# Patient Record
Sex: Male | Born: 1955 | Race: White | Hispanic: No | Smoking: Current every day smoker
Health system: Southern US, Community
[De-identification: ages and names within clinical notes are randomized; demographics above are authoritative.]

## PROBLEM LIST (undated history)

## (undated) DIAGNOSIS — J449 Chronic obstructive pulmonary disease, unspecified: Secondary | ICD-10-CM

## (undated) DIAGNOSIS — I219 Acute myocardial infarction, unspecified: Secondary | ICD-10-CM

## (undated) DIAGNOSIS — F319 Bipolar disorder, unspecified: Secondary | ICD-10-CM

## (undated) DIAGNOSIS — M1990 Unspecified osteoarthritis, unspecified site: Secondary | ICD-10-CM

## (undated) DIAGNOSIS — I10 Essential (primary) hypertension: Secondary | ICD-10-CM

## (undated) DIAGNOSIS — I2510 Atherosclerotic heart disease of native coronary artery without angina pectoris: Secondary | ICD-10-CM

## (undated) HISTORY — PX: HERNIA REPAIR: SHX51

## (undated) HISTORY — PX: CARDIAC CATHETERIZATION: SHX172

## (undated) HISTORY — PX: APPENDECTOMY: SHX54

## (undated) HISTORY — PX: CORONARY ANGIOPLASTY: SHX604

---

## 2002-02-15 ENCOUNTER — Inpatient Hospital Stay (HOSPITAL_COMMUNITY): Admission: EM | Admit: 2002-02-15 | Discharge: 2002-02-18 | Payer: Self-pay | Admitting: Psychiatry

## 2002-03-08 ENCOUNTER — Inpatient Hospital Stay (HOSPITAL_COMMUNITY): Admission: EM | Admit: 2002-03-08 | Discharge: 2002-03-11 | Payer: Self-pay | Admitting: Psychiatry

## 2002-03-14 ENCOUNTER — Other Ambulatory Visit (HOSPITAL_COMMUNITY): Admission: RE | Admit: 2002-03-14 | Discharge: 2002-03-25 | Payer: Self-pay | Admitting: Psychiatry

## 2004-10-30 ENCOUNTER — Inpatient Hospital Stay (HOSPITAL_COMMUNITY): Admission: RE | Admit: 2004-10-30 | Discharge: 2004-10-31 | Payer: Self-pay | Admitting: Neurosurgery

## 2005-06-18 ENCOUNTER — Ambulatory Visit: Payer: Self-pay | Admitting: Cardiology

## 2005-06-19 ENCOUNTER — Ambulatory Visit: Payer: Self-pay | Admitting: Cardiology

## 2005-06-19 ENCOUNTER — Encounter: Payer: Self-pay | Admitting: Cardiology

## 2005-06-19 ENCOUNTER — Inpatient Hospital Stay (HOSPITAL_COMMUNITY): Admission: AD | Admit: 2005-06-19 | Discharge: 2005-06-21 | Payer: Self-pay | Admitting: Cardiology

## 2005-07-23 ENCOUNTER — Ambulatory Visit: Payer: Self-pay | Admitting: Cardiology

## 2006-09-15 HISTORY — PX: ANTERIOR LAT LUMBAR FUSION: SHX1168

## 2007-03-09 ENCOUNTER — Inpatient Hospital Stay (HOSPITAL_COMMUNITY): Admission: RE | Admit: 2007-03-09 | Discharge: 2007-03-09 | Payer: Self-pay | Admitting: Neurosurgery

## 2009-06-10 ENCOUNTER — Inpatient Hospital Stay (HOSPITAL_COMMUNITY): Admission: EM | Admit: 2009-06-10 | Discharge: 2009-06-13 | Payer: Self-pay | Admitting: Emergency Medicine

## 2010-12-20 LAB — LIPID PANEL
Cholesterol: 174 mg/dL (ref 0–200)
Total CHOL/HDL Ratio: 5.2 RATIO
Total CHOL/HDL Ratio: 6.2 RATIO
VLDL: 50 mg/dL — ABNORMAL HIGH (ref 0–40)

## 2010-12-20 LAB — CBC
HCT: 39 % (ref 39.0–52.0)
MCHC: 34.1 g/dL (ref 30.0–36.0)
MCHC: 34.5 g/dL (ref 30.0–36.0)
MCV: 93.6 fL (ref 78.0–100.0)
Platelets: 244 10*3/uL (ref 150–400)
Platelets: 255 10*3/uL (ref 150–400)
RDW: 13.5 % (ref 11.5–15.5)
RDW: 13.6 % (ref 11.5–15.5)
WBC: 12.1 10*3/uL — ABNORMAL HIGH (ref 4.0–10.5)

## 2010-12-20 LAB — CARDIAC PANEL(CRET KIN+CKTOT+MB+TROPI)
CK, MB: 65.5 ng/mL — ABNORMAL HIGH (ref 0.3–4.0)
Total CK: 651 U/L — ABNORMAL HIGH (ref 7–232)
Total CK: 821 U/L — ABNORMAL HIGH (ref 7–232)
Troponin I: 11.91 ng/mL (ref 0.00–0.06)
Troponin I: 18.87 ng/mL (ref 0.00–0.06)

## 2010-12-20 LAB — TROPONIN I: Troponin I: 5.72 ng/mL (ref 0.00–0.06)

## 2010-12-20 LAB — BASIC METABOLIC PANEL
BUN: 8 mg/dL (ref 6–23)
Calcium: 7.8 mg/dL — ABNORMAL LOW (ref 8.4–10.5)
Calcium: 8.7 mg/dL (ref 8.4–10.5)
Chloride: 108 mEq/L (ref 96–112)
Creatinine, Ser: 0.65 mg/dL (ref 0.4–1.5)
GFR calc Af Amer: >60 mL/min (ref >60)
GFR calc non Af Amer: >60 mL/min (ref >60)
Glucose, Bld: 92 mg/dL (ref 70–99)

## 2010-12-20 LAB — POCT I-STAT, CHEM 8
BUN: 17 mg/dL (ref 6–23)
Calcium, Ion: 1.15 mmol/L (ref 1.12–1.32)
Creatinine, Ser: 0.6 mg/dL (ref 0.4–1.5)
Glucose, Bld: 131 mg/dL — ABNORMAL HIGH (ref 70–99)
TCO2: 21 mmol/L (ref 0–100)

## 2010-12-20 LAB — COMPREHENSIVE METABOLIC PANEL
ALT: 21 U/L (ref 0–53)
Alkaline Phosphatase: 66 U/L (ref 39–117)
CO2: 25 mEq/L (ref 19–32)
Chloride: 107 mEq/L (ref 96–112)
Glucose, Bld: 132 mg/dL — ABNORMAL HIGH (ref 70–99)
Potassium: 2.9 mEq/L — ABNORMAL LOW (ref 3.5–5.1)
Sodium: 138 mEq/L (ref 135–145)
Total Protein: 5.9 g/dL — ABNORMAL LOW (ref 6.0–8.3)

## 2010-12-20 LAB — PLATELET COUNT: Platelets: 254 10*3/uL (ref 150–400)

## 2010-12-20 LAB — CK TOTAL AND CKMB (NOT AT ARMC)
CK, MB: 14.1 ng/mL — ABNORMAL HIGH (ref 0.3–4.0)
CK, MB: 6.1 ng/mL — ABNORMAL HIGH (ref 0.3–4.0)
Relative Index: 6 — ABNORMAL HIGH (ref 0.0–2.5)

## 2010-12-20 LAB — GLUCOSE, CAPILLARY: Glucose-Capillary: 85 mg/dL (ref 70–99)

## 2010-12-20 LAB — PROTIME-INR: Prothrombin Time: 12.5 seconds (ref 11.6–15.2)

## 2011-01-28 NOTE — Op Note (Signed)
Christopher Conley, Christopher Conley NO.:  [id number]   MEDICAL RECORD NO.:  [id number]          PATIENT TYPE:  INP   LOCATION:  2899                         FACILITY:  [facility]   PHYSICIAN:  Kathaleen Maser. Pool, M.D.    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   PROCEDURES:  1. L4-5 decompressive laminectomies with L4 and L5 decompressive      foraminotomies, more than would be required for simple interbody      fusion alone.  2. L4-5 posterior lumbar interbody fusion utilizing Tangent interbody      allograft wedge, Telemon interbody PEEK cage, and local      autografting.  3. L4-5 posterolateral arthrodesis utilizing nonsegmental pedicle      screw sedation and local autografting.   SURGEON:  Kathaleen Maser. Pool, M.D.   ASSISTANT:  Reinaldo Meeker, M.D.   ANESTHESIA:  General.   INDICATION:  Christopher Conley is a 55 year old male with a history of severe  back and bilateral lower extremity pain, failing all conservative  measures.  Workup demonstrates evidence of a significant grade 1  __________  spondylolisthesis with marked stenosis involving both the  neural foramen and central canal.  The patient has been counseled as to  his options.  He has decided to proceed with L4-5 decompression and  fusion in hopes of improving symptoms.   OPERATIVE NOTE:  The patient was taken to the operating room and placed  on the table in a supine position.  After an adequate level of  anesthesia was achieved, the patient was positioned prone onto a Wilson  frame, appropriately padded.  The patient's lumbar region was prepped  and draped sterilely.  A #10 blade was used to make a linear skin  incision overlying the L4-5  interspace.  This was carried down sharply  in the midline.  Subperiosteal dissection was then performed exposing  the laminae and facet joints at L3, L4 and L5 as well as the transverse  processes of L4 and L5.  A deep self-retaining retractor was placed,  intraoperative fluoroscopy was used, and the level was confirmed.  Decompressive laminectomy was then performed at L4-5 using Leksell  rongeurs, Kerrison rongeurs and a high-speed drill to remove the entire  lamina of L4, the superior aspect of the lamina of L5, the entire  inferior facet of L4 bilaterally, and superior facet of L5 bilaterally.  All bone removed was cleaned and used in later autografting.  Ligament  flavum was then elevated and resected in a piecemeal fashion using  Kerrison rongeurs.  Wide decompressive foraminotomies were then  performed along the exiting L4 and L5 nerve roots bilaterally.  Epidural  venous plexus was coagulated and cut.  Starting first at the patient's  left side, thecal sac and nerve roots were mobilized and retracted  towards the midline.  Disk space incised with a 15 blade in a  rectangular fashion.  A wide space clean-out was then achieved using  pituitary rongeurs, upward-angled pituitary rongeurs and Epstein  curettes.  After an aggressive diskectomy was performed on this side,  the procedure was then repeated on the contralateral side.  The disk  space was then sequentially dilated up to 10 mm with a 10 mm distractor  left on the patient's right side.  Thecal sac and nerve roots were  protected on the patient's left side.  Disk space was then reamed and  then cut with 10 mm Tangent instruments.  Soft tissue was removed from  the interspace.  A 10 x 26 mm Tangent wedge was then impacted into  place, recessed approximately 2 mm from the posterior cortical margin of  L4.  Distractor was removed from the patient's right side.  Thecal sac  nerve roots protected on the right side.  Disk space was  then reamed and  then cut with 10 mm instruments again.  Soft tissue was removed from the  interspace.  The disk space was further curetted.  Morselized autograft  was packed in the interspace.  A 10 x 26 mm Telemon cage packed with  morselized autograft and Progenics bone putty was then impacted into  place and recessed approximately 2 mm from the posterior cortical margin  of L4.  The pedicles at L4 and L5 were then identified using surface  landmarks and intraoperative fluoroscopy.  Superficial bone around the  pedicle was then removed using a high-speed drill.  Each pedicle was  then probed using a pedicle awl.  Each pedicle awl track was then tapped  with a 5.25 mm screw tap.  Each screw tap hole was probed and found be  solidly within the bone.  Radius 6.75 x 45 mm screws were placed  bilaterally at L4 and L5.  Transverse processes at L4 and L5 were then  decorticated using the high-speed drill.  Morselized autograft mixed  with Progenics putty was packed posterolaterally for later fusion.  A  short segment of titanium rod was then placed over screw heads at L4 and  L5.  The locking caps were then engaged and the construct was given a  final tightening while under compression.  Final images revealed good  position of the bone grafts and hardware, proper operative level, and  normal alignment of the spine.  The wound was then irrigated with  antibiotic solution.  Gelfoam was placed topically for hemostasis and  found to be good.  A medium Hemovac drain was left in the epidural  space.  The wounds were closed in layers with Vicryl suture.  Steri-  Strips and sterile dressing were applied.  There were no apparent  complications.  The patient tolerated the procedure well and he returns  to the recovery room postoperatively.           ______________________________  Kathaleen Maser Pool, M.D.     HAP/MEDQ  D:  03/09/2007  T:  03/09/2007  Job:  [id number]
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
## 2011-01-31 NOTE — Cardiovascular Report (Signed)
[name], [name] NO.:  [id number]   MEDICAL RECORD NO.:  [id number]          PATIENT TYPE:  INP   LOCATION:  6524                         FACILITY:  [facility]   PHYSICIAN:  Arvilla Meres, M.D. LHCDATE OF BIRTH:  [birth year]   DATE OF PROCEDURE:  06/20/2005  DATE OF DISCHARGE:                              CARDIAC CATHETERIZATION   PRIMARY CARE PHYSICIAN:  Dr. Kirstie Peri in [place].   CARDIOLOGIST:  Dr. Simona Huh in the [place] office.   PATIENT IDENTIFICATION:  Christopher Conley is a 55 year old male with no known  history of coronary disease, but multiple cardiac risk factors and including  hypertension and hyperlipidemia as well as tobacco use, who was admitted  with acute coronary syndrome.   DESCRIPTION OF PROCEDURE:  The risks and benefits of the procedure were  discussed with Mr. Rames; consent was signed and placed on the chart.  A 6-  [place] arterial sheath was placed in right femoral artery using a modified  Seldinger technique.  A JL4 was used to image the left coronary system.  Initially, a JR4 was used to intubate the right coronary artery, but there  was a significant amount of spasm and catheter damping.  Thus we switched  out for a 5-French catheter.  We had some damping at that point and some  intracoronary nitroglycerin was given.  An angled pigtail was used for left  ventriculogram.  All catheter exchanges were made over a wire; there are no  apparent complications.  Central aortic pressure is 116/64 with a mean of  86.  LV pressure was 111/5 with an EDP of 13.  There is no gradient on a  aortic valve pullback.   CORONARY ANATOMY:  Left main:  Angiographically normal.   LAD was a long vessel wrapping the apex.  It gave off a single large  diagonal.  There was evidence of a ruptured plaque in the proximal LAD at  the takeoff of the large diagonal with a 99% stenosis.  In the body of the  first diagonal, there was a 40% tubular lesion.   The left  circumflex was a moderate-sized system.  It gave off a tiny OM-1, a  large branching OM-2 and a tiny OM-3 .  There was no angiographic CAD.   Right coronary artery was a large dominant vessel.  It gave off a right  ventricular branch, an acute marginal branch which served the PDA territory  and a small PL1 and a moderate-sized PL2.  There was a 50% to 60% lesion  ostially which likely represented spasm.  In the midsection of vessel, right  before the takeoff of the right ventricular branch, there was a 30% to 40%  tubular lesion and then a 30% distal lesion before the PL2.   Left ventriculogram done in the RAO approach showed an EF of approximately  50% with high anterior hypokinesis.  There was no mitral regurgitation.   ASSESSMENT:  1.  Single-vessel coronary artery disease with a ruptured plaque in the      proximal left anterior descending.  2.  Mild  decrease in left ventricular function with anterior wall motion      abnormality.   PLAN:  1.  PCI of the LAD with Dr. Randa Evens.  2.  Aggressive risk factor modification.      Arvilla Meres, M.D. [facility]  Electronically Signed     DB/MEDQ  D:  06/20/2005  T:  06/20/2005  Job:  [id number]   cc:   Kirstie Peri, MD  Fax: [phone]   Jonelle Sidle, M.D. [facility]  [address]  [place]  [place] [address]
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
## 2011-01-31 NOTE — Discharge Summary (Signed)
NAME:  Christopher Conley, Christopher Conley NO.:  [id number]   MEDICAL RECORD NO.:  [id number]                  PATIENT TYPE:  IPS   LOCATION:  PIOP                                 FACILITY:  [facility]   PHYSICIAN:  Jeanice Lim, MD                DATE OF BIRTH:  Dec 08, 1955   DATE OF ADMISSION:  03/08/2002  DATE OF DISCHARGE:  03/11/2002                                 DISCHARGE SUMMARY   IDENTIFYING DATA:  This is a 55 year old Caucasian male status post an  overdose on Seroquel, Prozac, and Depakote after an argument with his wife.  He described a history of sleeping too much, progressively irritable and  agitated prior to admission.   MEDICATIONS:  1. Prozac.  2. Depakote.  3. Seroquel.   DRUG ALLERGIES:  PENICILLIN.   PHYSICAL EXAMINATION:  GENERAL: Essentially within normal limits.  NEUROLOGIC: Nonfocal.   LABORATORY DATA:  Routine admission labs:  Potassium slightly low at 3.3,  glucose elevated at 168.  White count slightly elevated at 12,000.  Otherwise, within normal limits.   MENTAL STATUS EXAM:  Muscular, well built male, disheveled with poor  hygiene, appearing 10 years older than stated age, cooperative, polite with  blunted affect.  Speech: Relevant.  Mood: Depressed, remorseful, feeling  guilty about overdose and to losing his temper.  Thought process: Goal  directed.  Thought content: Negative for dangerous ideation or psychotic  symptoms.  He reported concern about losing his temper and not understanding  what happens and why he gets paranoid and easily frustrated.  Cognitive:  Intact.  Intellectual capacity: Limited.  Insight and impulse control: Poor.   ADMISSION DIAGNOSES:   AXIS I:  1. Major depression, recurrent, severe with psychosis.  2. Rule out intermittent explosive disorder.   AXIS II:  None.   AXIS III:  1. Status post multiple pharmacy overdose.  2. Chronic obstructive pulmonary disease.   AXIS IV:  Moderate problems related  to anger management and mental health  needs.   AXIS V:  [id number]   HOSPITAL COURSE:  The patient was admitted and ordered routine p.r.n.  medications, underwent further monitoring, and was encouraged to participate  in individual, group, and milieu therapy.  The patient was started on  Risperdal and Prozac and Depakote, which were titrated.  A family meeting  was held to discuss aftercare and treatment issues.  The patient showed a  good response to clinical intervention.   CONDITION ON DISCHARGE:  Condition on discharge was improved.  Mood was more  stable.  Affect: Brighter.  Thought processes: Goal directed.  Thought  content: Negative for dangerous ideation or psychotic symptoms.  The patient  reported increased insight and improved judgment, reporting motivation to be  compliant with followup plan.   DISCHARGE MEDICATIONS:  1. Prozac 20 mg q.a.m.  2. Depakote 250 mg three q.h.s.  3. Risperdal 0.5 mg one q.h.s.  FOLLOW UP:  He was to follow-up with [facility] on June  30 at 9 a.m.   DISCHARGE DIAGNOSES:   AXIS I:  1. Major depression, recurrent, severe with psychosis.  2. Rule out intermittent explosive disorder.   AXIS II:  None.   AXIS III:  1. Status post multiple pharmacy overdose.  2. Chronic obstructive pulmonary disease.   AXIS IV:  Moderate problems related to anger management and mental health  needs.   AXIS V:  Global assessment of functioning on discharge was 55.                                                Jeanice Lim, MD    JEM/MEDQ  D:  05/19/2002  T:  05/20/2002  Job:  [id number]

## 2011-01-31 NOTE — Discharge Summary (Signed)
NAMEKAYLAN, FRIEDMANN NO.:  [id number]   MEDICAL RECORD NO.:  [id number]          PATIENT TYPE:  INP   LOCATION:  6524                         FACILITY:  [facility]   PHYSICIAN:  Theodore Demark, P.A. LHCDATE OF BIRTH:  [birth year]   DATE OF ADMISSION:  06/19/2005  DATE OF DISCHARGE:  06/20/2005                                 DISCHARGE SUMMARY   PRIMARY CARDIOLOGIST:  Jonelle Sidle, M.D.   PRIMARY CARE PHYSICIAN:  Dr. Sherryll Burger.   PRINCIPAL DIAGNOSIS:  Non-ST segment elevation myocardial infarction.   SECONDARY DIAGNOSES:  1.  Hyperlipidemia.  2.  Depression.  3.  Osteoarthritis with chronic back pain.  4.  Tobacco use.  5.  Family history of premature coronary artery disease.  6.  Hyperglycemia, hemoglobin A1c pending.   ALLERGIES:  PENICILLIN.   PROCEDURES:  1.  Cardiac catheterization.  2.  Coronary arteriogram.  3.  Left ventriculogram.  4.  Percutaneous intervention with a Bare Metal Stent to the left anterior      descending and the CEPIA trial.   HOSPITAL COURSE:  Mr. Germond is a 55 year old male with no known of coronary  artery disease.  He had exertional dyspnea and fatigue.  He came to the  emergency room on June 18, 2005, and his EKG had some changes.  His  troponin was elevated 1.01, consistent with non-ST segment elevation MI.  He  was transferred to [facility] for further evaluation and treatment.   He remained pain free in the hospital, and cardiac catheterization was  performed to further define his anatomy.  He was enrolled in the CEPIA  study.   The cardiac catheterization showed a 99% LAD with clot.  Dr. Gala Romney felt  that there was a ruptured plaque in the proximal LAD.  He has mildly  decreased LV function with an EF of 50% and anterior wall motion  abnormalities.  He has a 50-60% RCA with spasm and between 30 and 40% in the  mid and distal RCA and first diagonal.   Dr. Samule Ohm inserted a Bare Metal Stent to the LAD and  reduced the stenosis  from 99% to 0.   On June 21, 2005, his enzymes were minimally elevated from what they had  been prior to admission, but he was pain free.  He was ambulating without  difficulty.  His catheterization site was without ecchymosis or hematoma.  Of note, he was hyperglycemic with a blood sugar of 172.  A hemoglobin A1c  is pending at the time of discharge.  Additionally, a lipid profile was  performed which showed a total cholesterol of 221, triglycerides 246, HDL  35, LDL 137.  Zocor 40 will be added to his medication regimen.   Mr. Crissman is to be seen by Dr. Andee Lineman and by cardiac rehab, but he is  tentatively considered stable for discharge on June 21, 2005 without  outpatient followup arranged.   DISCHARGE INSTRUCTIONS:  1.  Activity level is to be increased gradually.  He is to do no driving for      two days  and no lifting for two weeks.  He is to call our office for      problems with the catheterization site.  2.  He is to follow up with Dr. Sherryll Burger as needed.  3.  He has an appointment with Arnette Felts, P.A.-C. or Jonelle Sidle,      M.D. on July 01, 2005 at 1:15.  4.  He is to stick to a diet that is low in fat, sugar, and carbohydrates.   DISCHARGE MEDICATIONS:  1.  Cymbalta.  2.  Risperdal.  3.  Pain control as prior to admission.  4.  Plavix 75 daily.  5.  Coated aspirin 325 mg daily.  6.  Nitroglycerin 0.4 mg p.r.n.  7.  Toprol XL 25 mg daily.  8.  Zocor 40 mg daily.   DURATION OF ENCOUNTER:  25 minutes.      Theodore Demark, P.A. [facility]     RB/MEDQ  D:  06/21/2005  T:  06/21/2005  Job:  [id number]   cc:   Sherryll Burger, M.D.  [facility]  [place], [place]

## 2011-01-31 NOTE — Op Note (Signed)
Christopher Conley, TRAWEEK NO.:  [id number]   MEDICAL RECORD NO.:  [id number]          PATIENT TYPE:  INP   LOCATION:  3034                         FACILITY:  [facility]   PHYSICIAN:  Cristi Loron, M.D.DATE OF BIRTH:  1956/05/25   DATE OF PROCEDURE:  10/30/2004  DATE OF DISCHARGE:                                 OPERATIVE REPORT   BRIEF HISTORY:  The patient is a 55 year old white male who suffers from an  approximately one month history of severe left arm pain consistent with a  left C7 radiculopathy.  He has failed medical management and was worked up  with a cervical MRI which demonstrated a large herniated disc at C6-C7 on  the left.  I discussed the various treatment options with the patient  including surgery.  The patient has weighed the risks, benefits, and  alternatives of surgery and decided to proceed with a C6-C7 anterior  cervical discectomy, fusion, and plating.   PREOPERATIVE DIAGNOSIS:  Left C6-C7 herniated nucleus pulposus, spinal  stenosis, cervical radiculopathy, cervicalgia.   POSTOPERATIVE DIAGNOSIS:  Left C6-C7 herniated nucleus pulposus, spinal  stenosis, cervical radiculopathy, cervicalgia.   PROCEDURE:  C6-C7 extensive anterior cervical discectomy/decompression;  interbody iliac crest allograft arthrodesis; anterior cervical plating  (Codman Slimlock titanium plate and screws).   SURGEON:  Cristi Loron, M.D.   ASSISTANT:  Stefani Dama, M.D.   ANESTHESIA:  General endotracheal anesthesia.   ESTIMATED BLOOD LOSS:  50 mL.   SPECIMENS:  None.   DRAINS:  None.   COMPLICATIONS:  None.   DESCRIPTION OF PROCEDURE:  The patient was brought to the operating room by  the anesthesia team.  General endotracheal anesthesia was induced.  A roll  was placed under the patient's shoulders to place the neck in slight  extension.  His anterior cervical region was prepared with Betadine scrub  and Betadine solution.  Sterile drapes were  applied.  I then injected the  area to be incised with Marcaine with epinephrine solution.  I used a  scalpel to make a transverse incision in the patient's left anterior neck.  I used the Metzenbaum scissors to divide the platysmal muscle and to dissect  medial to the sternocleidomastoid muscle, jugular vein, and carotid artery.  I bluntly dissected down towards the anterior cervical spine, carefully  identified the esophagus and retracted it medially.  I used Kitner swabs to  clear soft tissue from the anterior cervical spine and then inserted a bent  spinal needle into the upper exposed interspace.  I obtained an  interoperative radiograph to confirm our location.   We then used electrocautery to detach the medial border of the longus colli  muscles bilaterally from C6-C7 intervertebral disc space.  We inserted the  Caspar self-retaining retractor underneath the longus colli muscles  bilaterally for exposure.  We incised the C6-C7 intervertebral disc and  performed a partial discectomy using pituitary forceps.  The disc space was  very stenotic.  We then inserted distraction screws at C6 and C7 and  distracted the interspace.  We used the high speed drill  to decorticate the  vertebral endplates at C6-C7, drill away the remainder of the C6-C7  intervertebral disc, drilled away the posterior spondylosis, and then  thinned out the posterior longitudinal ligament.  We incised the ligament  with the arachnoid knife.  I then removed it with a Kerrison punch under  cutting the vertebral endplate decompressing the thecal sac.  We then  performed a foraminotomy about the bilateral C7 nerve root.  On the left  side, we encountered a huge herniated disc and removed it in multiple  fragments using pituitary forceps and a Kerrison punch.  We performed a  generous foraminotomy about the bilateral C7 nerve root completing the  decompression.   We now turned our attention to the arthrodesis.  We  obtained iliac crest  tricortical allograft bone graft and fashioned it to the dimensions, 7 mm  height, 1 cm depth.  We inserted the bone graft in the distracted C6-C7  interspace and then removed the distraction screws.  There was a good snug  fit of bone graft.   We now turned our attention to the instrumentation.  We used a high speed  drill to drill away some ventral spondylosis from the vertebral endplates of  C6-C7 so that the plate would lie flat.  We selected the appropriate length  Codman Slimlock anterior cervical plate, we laid it on the anterior aspect  of the vertebral bodies from C6 to C7, we drilled two holes into C6 and C7.  We secured the plate to the vertebral bodies by placing two 12 mm self-  tapping screws at C6 and two at C7.  We obtained an interoperative  radiograph.  We had limited visualization because of the patient's  shoulders, but the plate and screws looked good en vivo.  We secured the  screws and plate by locking each cam.   We then obtained stringent hemostasis using bipolar electrocautery.  We  copiously irrigated the wound out with Bacitracin solution and removed the  solution.  We then removed the Caspar self-retaining retractor.  We  inspected the esophagus for any damage and there was none apparent.  We then  reapproximated the patient's platysmal muscle with interrupted 3-0 Vicryl  suture, the subcutaneous tissue with interrupted 3-0 Vicryl suture, and the  skin with Steri-Strips and Benzoin.  The wound was then coated with  Bacitracin ointment.  A sterile dressing was applied, the drapes were  removed.  The patient was subsequently extubated by the anesthesia team and  transported to the post anesthesia care unit in stable condition.  All  sponge, instrument and needle counts were correct at the end of the case.      JDJ/MEDQ  D:  10/30/2004  T:  10/30/2004  Job:  [id number]

## 2011-01-31 NOTE — H&P (Signed)
Christopher Conley, PARCELL NO.:  [id number]   MEDICAL RECORD NO.:  [id number]          PATIENT TYPE:  INP   LOCATION:  6524                         FACILITY:  [facility]   PHYSICIAN:  Jonelle Sidle, M.D. LHCDATE OF BIRTH:  [birth year]   DATE OF ADMISSION:  06/19/2005  DATE OF DISCHARGE:                                HISTORY & PHYSICAL   PRIMARY CARE PHYSICIAN:  Kirstie Peri, M.D.   REASON FOR ADMISSION:  Transfer from [facility] with  evidence of non-ST elevation myocardial infarction.   HISTORY OF PRESENT ILLNESS:  Christopher Conley is a 55 year old male with an apparent  history of depression treated with Cymbalta, osteoarthritis with chronic  back pain, ongoing tobacco use of two packs per day, significant family  history of premature cardiovascular disease, and recent umbilical hernia  repair surgery this past Tuesday by Dr. Gabriel Cirri [place].  He  is now transferred to our facility, having presented to [facility] following a severe episode of chest pain and shortness of breath.  Christopher Conley states that he has in retrospect had exertional shortness of breath  and fatigue for the last several months.  Over the last one to two weeks he  has experienced chest aching lasting 15-20 minutes at a time associated with  shortness of breath and diaphoresis, typically occurring every two days and  culminating in a severe episode on October 4, prompting his evaluation in  the emergency department.  His electrocardiogram showed sinus rhythm with  poor anterior R-wave progression and nonspecific anterolateral ST-T wave  changes.  These look to be new compared to a prior tracing from January.  Troponin I levels increased from 0.20 up to 1.01 and chest x-ray showed some  evidence of mild pulmonary edema.  On transfer now, he is pain-free and  breathing comfortably.  He denies any history of coronary artery disease or  myocardial infarction.  He  has not undergone any prior cardiac testing as  best I can tell.   ALLERGIES:  No known drug allergies.   MEDICATIONS AT HOME:  Cymbalta and omeprazole as well as some type of pain  medicine.   PAST MEDICAL HISTORY:  1.  Depression.  2.  Possible hypertension, although not on chronic medications.  3.  Osteoarthritis with chronic back pain.  4.  History of cervical disk fusion in March or April of this year.  5.  Recent umbilical hernia surgery by Dr. Gabriel Cirri in [place], [place].      This was possibly laparoscopic.  He was actually to follow up this      Friday for postoperative care.   SOCIAL HISTORY:  The patient is single and lives alone.  He has one child.  He smokes two packs per day of tobacco.  He is unemployed.   FAMILY HISTORY:  Significant for premature cardiovascular disease in the  patient's father at age 29, a brother at age 70, and another brother at age  24 with recent bypass surgery.   REVIEW OF SYSTEMS:  As described in the history  of present illness.  He has  had no obvious bleeding problems.  He has had no orthopnea, palpitations or  syncope.  He denies lower extremity edema.  Review of systems otherwise  negative.   PHYSICAL EXAMINATION:  VITAL SIGNS:  Blood pressure 130/80, heart rate is in  the 80s in sinus rhythm.  The patient is afebrile.  GENERAL:  This is a somewhat disheveled but well-nourished male in no acute  distress.  HEENT:  Conjunctivae and lids are normal.  Oropharynx is clear.  NECK:  Supple without elevated jugular venous pressure, without carotid  bruits.  No thyromegaly is noted.  CHEST:  Lungs exhibit decreased breath sounds with prolonged expiratory  phase and mild expiratory wheezes.  CARDIAC:  Distant heart sounds with regular rate and rhythm and no S3 gallop  or pericardial rub.  ABDOMEN:  Soft with no hepatomegaly and no bruits.  EXTREMITIES:  No pitting edema.  Peripheral pulses are 1-2+.  SKIN:  No ulcerative changes are  noted.  MUSCULOSKELETAL:  No kyphosis noted.  NEUROPSYCHIATRIC:  The patient is alert and oriented x3.   Laboratory data from [facility]:  Glucose 121, BUN 11,  creatinine 0.8, sodium 137, potassium 3.7, chloride 106, bicarb 25.  WBC  14.1, hemoglobin 14.7, hematocrit 44.5, platelets 349.   IMPRESSION:  1.  Progressive dyspnea and fatigue on exertion culminating in episodic      chest pain and no evidence of non-ST elevation myocardial infarction      with abnormal electrocardiographic changes and cardiac markers.  2.  Possible hypertension, although on no chronic medications.  3.  Significant tobacco use history.  4.  Family history of premature cardiovascular disease.  5.  Status post recent umbilical hernia surgery this past Tuesday by Dr.      Gabriel Cirri in [place], [place].  This was possibly laparoscopic, and      the patient tells me that he had anticipated follow-up this Friday with      Dr. Gabriel Cirri.   PLAN:  1.  The patient is now admitted to the telemetry unit.  We will continue to      cycle cardiac markers.  2.  Treat with aspirin, Lopressor, heparin and nitroglycerin for now and      titrate as needed.  3.  Check fasting lipid profile, hemoglobin A1c, and follow up      electrocardiogram.  4.  Smoking cessation consult.  5.  Check 2-D echocardiogram for left ventricular function.  6.  Need to find out from Dr. Gabriel Cirri in [place] if there are any anticipated      problems with proceeding with cardiac catheterization in this patient      given his recent surgery.  I would suspect that he will need some type      of intervention and, therefore, Plavix.  Will otherwise be tentatively      scheduled for cardiac catheterization tomorrow assuming there are no      major contraindications.  I reviewed the risks and benefits with the      patient and he agrees to proceed at this point. 7.  Further plans to follow.           ______________________________   Jonelle Sidle, M.D. [facility]    SGM/MEDQ  D:  06/19/2005  T:  06/19/2005  Job:  [id number]   cc:   Kirstie Peri, MD  Fax: [phone]

## 2011-01-31 NOTE — Cardiovascular Report (Signed)
NAMEBELTON, PEPLINSKI NO.:  [id number]   MEDICAL RECORD NO.:  [id number]          PATIENT TYPE:  INP   LOCATION:  6524                         FACILITY:  [facility]   PHYSICIAN:  Salvadore Farber, M.D. LHCDATE OF BIRTH:  September 30, 1955   DATE OF PROCEDURE:  06/20/2005  DATE OF DISCHARGE:                              CARDIAC CATHETERIZATION   PROCEDURE:  Bear metal stenting in the proximal left anterior descending.   INDICATIONS FOR PROCEDURE:  Mr. Deroos is a 55 year old gentleman with ongoing  tobacco abuse, who presents with non-ST elevation myocardial infarction.  Troponin leak was small. He underwent diagnostic angiography by Dr.  Gala Romney this morning. 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 Left  ventricular systolic function was preserved. I was asked to perform  percutaneous re-vascularization.   DESCRIPTION OF PROCEDURE:  Informed consent was obtained. There was a pre-  existing 6 [place] sheath. This was up-sized over a wire to a 7 French sheath  in the right common femoral artery. A 7 [place] CLS 4 guide was advanced over  wire and engaged in the ostium of the left main. Anticoagulation was per the  CPIA trial randomization. He was treated with 600 mg of Plavix, prior to  beginning the intervention.   A Pro-water wire was advanced to the distal left anterior descending without  difficulty. A BMW wire was then advanced to the distal diagonal. The lesion  was pre-dilated using a 3.0 x 12 mm Maverick at 6 atmospheres. I then  stented the lesion using a 3.5 x 24 mm driver, deployed at 12 atmospheres.  This was deployed so as to trap the BMW wire, in case the diagonal was lost.  After stenting, a second BMW wire was advanced out the side of the stent,  into the diagonal. The original BMW was then removed from behind the  stent  without difficulty. The stent was then post-dilated using a 3.5 mm power  sail at 16 atmospheres. There was no significant plaque shift into the  diagonal. Final angiography demonstrated no residual stenosis, no  dissection, TIMI 3 flow to the distal vasculature. There remained mild  stenosis at the origin of the diagonal (less then 30%), and TIMI 3 flow in  it. The patient tolerated the procedure well and was transferred to the  holding room in stable condition.   COMPLICATIONS:  None.   IMPRESSION/PLAN:  Successful bare metal stenting of the proximal left  anterior descending. The jailed diagonal was not compromised. He should be  continued on Plavix for at least a year, given his acute presentation.  Aspirin should be continued indefinitely. Smoking cessation was strongly  advised.      Salvadore Farber, M.D. [facility]  Electronically Signed     WED/MEDQ  D:  06/20/2005  T:  06/20/2005  Job:  [id number]   cc:   Kirstie Peri, MD  Fax: [phone]   Arvilla Meres, M.D. [facility]  [employer]  [address]  [place]  [place] [address]

## 2011-07-02 LAB — DIFFERENTIAL
Basophils Relative: 1
Lymphs Abs: 2.8
Monocytes Relative: 5
Neutro Abs: 6.4
Neutrophils Relative %: 64

## 2011-07-02 LAB — BASIC METABOLIC PANEL
Calcium: 9.1
Creatinine, Ser: 0.78
GFR calc Af Amer: >60

## 2011-07-02 LAB — PROTIME-INR
INR: 0.9
Prothrombin Time: 12.3

## 2011-07-02 LAB — TYPE AND SCREEN

## 2011-07-02 LAB — CBC
MCHC: 34.2
Platelets: 300
RBC: 5
WBC: 10

## 2011-07-02 LAB — APTT: aPTT: 31

## 2015-09-18 DIAGNOSIS — Z6828 Body mass index (BMI) 28.0-28.9, adult: Secondary | ICD-10-CM | POA: Diagnosis not present

## 2015-09-18 DIAGNOSIS — J441 Chronic obstructive pulmonary disease with (acute) exacerbation: Secondary | ICD-10-CM | POA: Diagnosis not present

## 2015-09-18 DIAGNOSIS — E784 Other hyperlipidemia: Secondary | ICD-10-CM | POA: Diagnosis not present

## 2015-09-18 DIAGNOSIS — I10 Essential (primary) hypertension: Secondary | ICD-10-CM | POA: Diagnosis not present

## 2015-09-18 DIAGNOSIS — F17210 Nicotine dependence, cigarettes, uncomplicated: Secondary | ICD-10-CM | POA: Diagnosis not present

## 2015-11-30 DIAGNOSIS — Z6828 Body mass index (BMI) 28.0-28.9, adult: Secondary | ICD-10-CM | POA: Diagnosis not present

## 2015-11-30 DIAGNOSIS — R0981 Nasal congestion: Secondary | ICD-10-CM | POA: Diagnosis not present

## 2015-11-30 DIAGNOSIS — L2089 Other atopic dermatitis: Secondary | ICD-10-CM | POA: Diagnosis not present

## 2015-12-24 DIAGNOSIS — M9983 Other biomechanical lesions of lumbar region: Secondary | ICD-10-CM | POA: Diagnosis not present

## 2015-12-24 DIAGNOSIS — M47816 Spondylosis without myelopathy or radiculopathy, lumbar region: Secondary | ICD-10-CM | POA: Diagnosis not present

## 2016-02-13 DIAGNOSIS — Z8371 Family history of colonic polyps: Secondary | ICD-10-CM | POA: Diagnosis not present

## 2016-02-13 DIAGNOSIS — R194 Change in bowel habit: Secondary | ICD-10-CM | POA: Diagnosis not present

## 2016-02-19 ENCOUNTER — Encounter (INDEPENDENT_AMBULATORY_CARE_PROVIDER_SITE_OTHER): Payer: Self-pay | Admitting: *Deleted

## 2016-03-07 DIAGNOSIS — I2510 Atherosclerotic heart disease of native coronary artery without angina pectoris: Secondary | ICD-10-CM | POA: Diagnosis not present

## 2016-03-07 DIAGNOSIS — J441 Chronic obstructive pulmonary disease with (acute) exacerbation: Secondary | ICD-10-CM | POA: Diagnosis not present

## 2016-03-07 DIAGNOSIS — E784 Other hyperlipidemia: Secondary | ICD-10-CM | POA: Diagnosis not present

## 2016-03-07 DIAGNOSIS — Z6828 Body mass index (BMI) 28.0-28.9, adult: Secondary | ICD-10-CM | POA: Diagnosis not present

## 2016-03-07 DIAGNOSIS — I10 Essential (primary) hypertension: Secondary | ICD-10-CM | POA: Diagnosis not present

## 2016-03-19 ENCOUNTER — Other Ambulatory Visit (INDEPENDENT_AMBULATORY_CARE_PROVIDER_SITE_OTHER): Payer: Self-pay | Admitting: *Deleted

## 2016-03-19 ENCOUNTER — Telehealth (INDEPENDENT_AMBULATORY_CARE_PROVIDER_SITE_OTHER): Payer: Self-pay | Admitting: *Deleted

## 2016-03-19 ENCOUNTER — Encounter (INDEPENDENT_AMBULATORY_CARE_PROVIDER_SITE_OTHER): Payer: Self-pay | Admitting: *Deleted

## 2016-03-19 DIAGNOSIS — Z1211 Encounter for screening for malignant neoplasm of colon: Secondary | ICD-10-CM

## 2016-03-19 NOTE — Telephone Encounter (Signed)
Referring MD/PCP: [email or web address]   Procedure: tcs  Reason/Indication:  screening  Has patient had this procedure before?  Yes, 9-10 yrs ago  If so, when, by whom and where?    Is there a family history of colon cancer?  no  Who?  What age when diagnosed?    Is patient diabetic?   no      Does patient have prosthetic heart valve or mechanical valve?  no  Do you have a pacemaker?  no  Has patient ever had endocarditis? no  Has patient had joint replacement within last 12 months?  no  Does patient tend to be constipated or take laxatives? o  Does patient have a history of alcohol/drug use?  no  Is patient on Coumadin, Plavix and/or Aspirin? yes  Medications: chantix 1 mg bid, tamsulosin 4 mg daily, morphine 15 mg daily, simvastatin 80 mg daily, metoprolol 50 mg daily, effient 10 mg daily, ventolin inhaler prn, doxycycline 100 mg daily  Allergies: nkda  Medication Adjustment: effient 2 days  Procedure date & time: 04/18/16 at 830 Pre-op 8/1 @ 9

## 2016-03-19 NOTE — Telephone Encounter (Signed)
Patient needs trilyte 

## 2016-03-20 ENCOUNTER — Telehealth (INDEPENDENT_AMBULATORY_CARE_PROVIDER_SITE_OTHER): Payer: Self-pay | Admitting: *Deleted

## 2016-03-20 MED ORDER — PEG 3350-KCL-NA BICARB-NACL 420 G PO SOLR: 4000.0000 mL | Freq: Once | ORAL | Status: DC

## 2016-03-20 NOTE — Telephone Encounter (Signed)
Per Dr Sherrie Sport, patient can stop Effient 2 days prior to TCS sch'd 04/18/16

## 2016-03-20 NOTE — Telephone Encounter (Signed)
agree

## 2016-04-14 NOTE — Patient Instructions (Addendum)
Christopher Conley  04/14/2016     @PREFPERIOPPHARMACY @   Your procedure is scheduled on  04/18/2016   Report to [facility] at  615 A.M.  Call this number if you have problems the morning of surgery:  [phone]   Remember:  Do not eat food or drink liquids after midnight.  Take these medicines the morning of surgery with A SIP OF WATER  Metoprolol, Tamulosin, Morphine if needed   Do not wear jewelry, make-up or nail polish.  Do not wear lotions, powders, or perfumes.  You may wear deoderant.  Do not shave 48 hours prior to surgery.  Men may shave face and neck.  Do not bring valuables to the hospital.  [facility] is not responsible for any belongings or valuables.  Contacts, dentures or bridgework may not be worn into surgery.  Leave your suitcase in the car.  After surgery it may be brought to your room.  For patients admitted to the hospital, discharge time will be determined by your treatment team.  Patients discharged the day of surgery will not be allowed to drive home.   Name and phone number of your driver:   family Special instructions:  Follow the diet and prep instructions given to you by Dr Olevia Perches office.  Please read over the following fact sheets that you were given. Coughing and Deep Breathing, Surgical Site Infection Prevention, Anesthesia Post-op Instructions and Care and Recovery After Surgery       Colonoscopy A colonoscopy is an exam to look at the entire large intestine (colon). This exam can help find problems such as tumors, polyps, inflammation, and areas of bleeding. The exam takes about 1 hour.  LET [facility] CARE PROVIDER KNOW ABOUT:   Any allergies you have.  All medicines you are taking, including vitamins, herbs, eye drops, creams, and over-the-counter medicines.  Previous problems you or members of your family have had with the use of anesthetics.  Any blood disorders you have.  Previous surgeries you have  had.  Medical conditions you have. RISKS AND COMPLICATIONS  Generally, this is a safe procedure. However, as with any procedure, complications can occur. Possible complications include:  Bleeding.  Tearing or rupture of the colon wall.  Reaction to medicines given during the exam.  Infection (rare). BEFORE THE PROCEDURE   Ask your health care provider about changing or stopping your regular medicines.  You may be prescribed an oral bowel prep. This involves drinking a large amount of medicated liquid, starting the day before your procedure. The liquid will cause you to have multiple loose stools until your stool is almost clear or light green. This cleans out your colon in preparation for the procedure.  Do not eat or drink anything else once you have started the bowel prep, unless your health care provider tells you it is safe to do so.  Arrange for someone to drive you home after the procedure. PROCEDURE   You will be given medicine to help you relax (sedative).  You will lie on your side with your knees bent.  A long, flexible tube with a light and camera on the end (colonoscope) will be inserted through the rectum and into the colon. The camera sends video back to a computer screen as it moves through the colon. The colonoscope also releases carbon dioxide gas to inflate the colon. This helps your health care provider see the area better.  During the  exam, your health care provider may take a small tissue sample (biopsy) to be examined under a microscope if any abnormalities are found.  The exam is finished when the entire colon has been viewed. AFTER THE PROCEDURE   Do not drive for 24 hours after the exam.  You may have a small amount of blood in your stool.  You may pass moderate amounts of gas and have mild abdominal cramping or bloating. This is caused by the gas used to inflate your colon during the exam.  Ask when your test results will be ready and how you will  get your results. Make sure you get your test results.   This information is not intended to replace advice given to you by your health care provider. Make sure you discuss any questions you have with your health care provider.   Document Released: 08/29/2000 Document Revised: 06/22/2013 Document Reviewed: 05/09/2013 Elsevier Interactive Patient Education 2016 [employer] Colonoscopy, Care After Refer to this sheet in the next few weeks. These instructions provide you with information on caring for yourself after your procedure. Your health care provider may also give you more specific instructions. Your treatment has been planned according to current medical practices, but problems sometimes occur. Call your health care provider if you have any problems or questions after your procedure. WHAT TO EXPECT AFTER THE PROCEDURE  After your procedure, it is typical to have the following:  A small amount of blood in your stool.  Moderate amounts of gas and mild abdominal cramping or bloating. HOME CARE INSTRUCTIONS  Do not drive, operate machinery, or sign important documents for 24 hours.  You may shower and resume your regular physical activities, but move at a slower pace for the first 24 hours.  Take frequent rest periods for the first 24 hours.  Walk around or put a warm pack on your abdomen to help reduce abdominal cramping and bloating.  Drink enough fluids to keep your urine clear or pale yellow.  You may resume your normal diet as instructed by your health care provider. Avoid heavy or fried foods that are hard to digest.  Avoid drinking alcohol for 24 hours or as instructed by your health care provider.  Only take over-the-counter or prescription medicines as directed by your health care provider.  If a tissue sample (biopsy) was taken during your procedure:  Do not take aspirin or blood thinners for 7 days, or as instructed by your health care provider.  Do not drink  alcohol for 7 days, or as instructed by your health care provider.  Eat soft foods for the first 24 hours. SEEK MEDICAL CARE IF: You have persistent spotting of blood in your stool 2-3 days after the procedure. SEEK IMMEDIATE MEDICAL CARE IF:  You have more than a small spotting of blood in your stool.  You pass large blood clots in your stool.  Your abdomen is swollen (distended).  You have nausea or vomiting.  You have a fever.  You have increasing abdominal pain that is not relieved with medicine.   This information is not intended to replace advice given to you by your health care provider. Make sure you discuss any questions you have with your health care provider.   Document Released: 04/15/2004 Document Revised: 06/22/2013 Document Reviewed: 05/09/2013 Elsevier Interactive Patient Education 2016 Elsevier Inc. PATIENT INSTRUCTIONS POST-ANESTHESIA  IMMEDIATELY FOLLOWING SURGERY:  Do not drive or operate machinery for the first twenty four hours after surgery.  Do not make  any important decisions for twenty four hours after surgery or while taking narcotic pain medications or sedatives.  If you develop intractable nausea and vomiting or a severe headache please notify your doctor immediately.  FOLLOW-UP:  Please make an appointment with your surgeon as instructed. You do not need to follow up with anesthesia unless specifically instructed to do so.  WOUND CARE INSTRUCTIONS (if applicable):  Keep a dry clean dressing on the anesthesia/puncture wound site if there is drainage.  Once the wound has quit draining you may leave it open to air.  Generally you should leave the bandage intact for twenty four hours unless there is drainage.  If the epidural site drains for more than 36-48 hours please call the anesthesia department.  QUESTIONS?:  Please feel free to call your physician or the hospital operator if you have any questions, and they will be happy to assist you.       Monitored Anesthesia Care Monitored anesthesia care is an anesthesia service for a medical procedure. Anesthesia is the loss of the ability to feel pain. It is produced by medicines called anesthetics. It may affect a small area of your body (local anesthesia), a large area of your body (regional anesthesia), or your entire body (general anesthesia). The need for monitored anesthesia care depends your procedure, your condition, and the potential need for regional or general anesthesia. It is often provided during procedures where:   General anesthesia may be needed if there are complications. This is because you need special care when you are under general anesthesia.   You will be under local or regional anesthesia. This is so that you are able to have higher levels of anesthesia if needed.   You will receive calming medicines (sedatives). This is especially the case if sedatives are given to put you in a semi-conscious state of relaxation (deep sedation). This is because the amount of sedative needed to produce this state can be hard to predict. Too much of a sedative can produce general anesthesia. Monitored anesthesia care is performed by one or more health care providers who have special training in all types of anesthesia. You will need to meet with these health care providers before your procedure. During this meeting, they will ask you about your medical history. They will also give you instructions to follow. (For example, you will need to stop eating and drinking before your procedure. You may also need to stop or change medicines you are taking.) During your procedure, your health care providers will stay with you. They will:   Watch your condition. This includes watching your blood pressure, breathing, and level of pain.   Diagnose and treat problems that occur.   Give medicines if they are needed. These may include calming medicines (sedatives) and anesthetics.   Make sure you  are comfortable.  Having monitored anesthesia care does not necessarily mean that you will be under anesthesia. It does mean that your health care providers will be able to manage anesthesia if you need it or if it occurs. It also means that you will be able to have a different type of anesthesia than you are having if you need it. When your procedure is complete, your health care providers will continue to watch your condition. They will make sure any medicines wear off before you are allowed to go home.    This information is not intended to replace advice given to you by your health care provider. Make sure you discuss any  questions you have with your health care provider.   Document Released: 05/28/2005 Document Revised: 09/22/2014 Document Reviewed: 10/13/2012 Elsevier Interactive Patient Education [employer].

## 2016-04-15 ENCOUNTER — Encounter (HOSPITAL_COMMUNITY)
Admission: RE | Admit: 2016-04-15 | Discharge: 2016-04-15 | Disposition: A | Discharge status: Home / Self Care | Payer: Commercial Managed Care - HMO | Source: Ambulatory Visit | Attending: Internal Medicine | Admitting: Internal Medicine

## 2016-04-15 ENCOUNTER — Other Ambulatory Visit: Payer: Self-pay

## 2016-04-15 ENCOUNTER — Encounter (HOSPITAL_COMMUNITY): Payer: Self-pay

## 2016-04-15 DIAGNOSIS — I4510 Unspecified right bundle-branch block: Secondary | ICD-10-CM | POA: Diagnosis not present

## 2016-04-15 DIAGNOSIS — I10 Essential (primary) hypertension: Secondary | ICD-10-CM | POA: Diagnosis not present

## 2016-04-15 DIAGNOSIS — D125 Benign neoplasm of sigmoid colon: Secondary | ICD-10-CM | POA: Diagnosis not present

## 2016-04-15 DIAGNOSIS — I252 Old myocardial infarction: Secondary | ICD-10-CM | POA: Diagnosis not present

## 2016-04-15 DIAGNOSIS — Z1211 Encounter for screening for malignant neoplasm of colon: Secondary | ICD-10-CM | POA: Diagnosis not present

## 2016-04-15 DIAGNOSIS — F319 Bipolar disorder, unspecified: Secondary | ICD-10-CM | POA: Diagnosis not present

## 2016-04-15 DIAGNOSIS — F17210 Nicotine dependence, cigarettes, uncomplicated: Secondary | ICD-10-CM | POA: Diagnosis not present

## 2016-04-15 DIAGNOSIS — M1990 Unspecified osteoarthritis, unspecified site: Secondary | ICD-10-CM | POA: Diagnosis not present

## 2016-04-15 DIAGNOSIS — Z79899 Other long term (current) drug therapy: Secondary | ICD-10-CM | POA: Diagnosis not present

## 2016-04-15 DIAGNOSIS — D123 Benign neoplasm of transverse colon: Secondary | ICD-10-CM | POA: Diagnosis not present

## 2016-04-15 DIAGNOSIS — J449 Chronic obstructive pulmonary disease, unspecified: Secondary | ICD-10-CM | POA: Diagnosis not present

## 2016-04-15 DIAGNOSIS — K5900 Constipation, unspecified: Secondary | ICD-10-CM | POA: Diagnosis not present

## 2016-04-15 DIAGNOSIS — I2510 Atherosclerotic heart disease of native coronary artery without angina pectoris: Secondary | ICD-10-CM | POA: Diagnosis not present

## 2016-04-15 DIAGNOSIS — Z981 Arthrodesis status: Secondary | ICD-10-CM | POA: Diagnosis not present

## 2016-04-15 DIAGNOSIS — I491 Atrial premature depolarization: Secondary | ICD-10-CM | POA: Diagnosis not present

## 2016-04-15 HISTORY — DX: Acute myocardial infarction, unspecified: I21.9

## 2016-04-15 HISTORY — DX: Unspecified osteoarthritis, unspecified site: M19.90

## 2016-04-15 HISTORY — DX: Chronic obstructive pulmonary disease, unspecified: J44.9

## 2016-04-15 HISTORY — DX: Essential (primary) hypertension: I10

## 2016-04-15 HISTORY — DX: Bipolar disorder, unspecified: F31.9

## 2016-04-15 HISTORY — DX: Atherosclerotic heart disease of native coronary artery without angina pectoris: I25.10

## 2016-04-15 LAB — CBC WITH DIFFERENTIAL/PLATELET
BASOS ABS: 0.1 10*3/uL (ref 0.0–0.1)
BASOS PCT: 1 %
EOS ABS: 0.5 10*3/uL (ref 0.0–0.7)
Eosinophils Relative: 6 %
HEMATOCRIT: 45.5 % (ref 39.0–52.0)
HEMOGLOBIN: 15.3 g/dL (ref 13.0–17.0)
Lymphocytes Relative: 27 %
Lymphs Abs: 2.5 10*3/uL (ref 0.7–4.0)
MCH: 30.9 pg (ref 26.0–34.0)
MCHC: 33.6 g/dL (ref 30.0–36.0)
MCV: 91.9 fL (ref 78.0–100.0)
MONOS PCT: 6 %
Monocytes Absolute: 0.6 10*3/uL (ref 0.1–1.0)
NEUTROS ABS: 5.6 10*3/uL (ref 1.7–7.7)
NEUTROS PCT: 60 %
Platelets: 212 10*3/uL (ref 150–400)
RBC: 4.95 MIL/uL (ref 4.22–5.81)
RDW: 13.3 % (ref 11.5–15.5)
WBC: 9.2 10*3/uL (ref 4.0–10.5)

## 2016-04-15 LAB — BASIC METABOLIC PANEL
ANION GAP: 5 (ref 5–15)
BUN: 12 mg/dL (ref 6–20)
CHLORIDE: 107 mmol/L (ref 101–111)
CO2: 23 mmol/L (ref 22–32)
CREATININE: 0.87 mg/dL (ref 0.61–1.24)
Calcium: 8.5 mg/dL — ABNORMAL LOW (ref 8.9–10.3)
GFR calc non Af Amer: >60 mL/min (ref >60)
Glucose, Bld: 135 mg/dL — ABNORMAL HIGH (ref 65–99)
POTASSIUM: 3.9 mmol/L (ref 3.5–5.1)
SODIUM: 135 mmol/L (ref 135–145)

## 2016-04-18 ENCOUNTER — Ambulatory Visit (HOSPITAL_COMMUNITY)
Admission: RE | Admit: 2016-04-18 | Discharge: 2016-04-18 | Disposition: A | Discharge status: Home / Self Care | Payer: Commercial Managed Care - HMO | Source: Ambulatory Visit | Attending: Internal Medicine | Admitting: Internal Medicine

## 2016-04-18 ENCOUNTER — Ambulatory Visit (HOSPITAL_COMMUNITY): Payer: Commercial Managed Care - HMO | Admitting: Anesthesiology

## 2016-04-18 ENCOUNTER — Encounter (HOSPITAL_COMMUNITY)
Admission: RE | Disposition: A | Discharge status: Home / Self Care | Payer: Self-pay | Source: Ambulatory Visit | Attending: Internal Medicine

## 2016-04-18 ENCOUNTER — Encounter (HOSPITAL_COMMUNITY): Payer: Self-pay | Admitting: *Deleted

## 2016-04-18 DIAGNOSIS — I491 Atrial premature depolarization: Secondary | ICD-10-CM | POA: Insufficient documentation

## 2016-04-18 DIAGNOSIS — K5900 Constipation, unspecified: Secondary | ICD-10-CM | POA: Insufficient documentation

## 2016-04-18 DIAGNOSIS — Z79899 Other long term (current) drug therapy: Secondary | ICD-10-CM | POA: Insufficient documentation

## 2016-04-18 DIAGNOSIS — D123 Benign neoplasm of transverse colon: Secondary | ICD-10-CM | POA: Diagnosis not present

## 2016-04-18 DIAGNOSIS — I2510 Atherosclerotic heart disease of native coronary artery without angina pectoris: Secondary | ICD-10-CM | POA: Insufficient documentation

## 2016-04-18 DIAGNOSIS — M1990 Unspecified osteoarthritis, unspecified site: Secondary | ICD-10-CM | POA: Diagnosis not present

## 2016-04-18 DIAGNOSIS — J449 Chronic obstructive pulmonary disease, unspecified: Secondary | ICD-10-CM | POA: Diagnosis not present

## 2016-04-18 DIAGNOSIS — D125 Benign neoplasm of sigmoid colon: Secondary | ICD-10-CM | POA: Diagnosis not present

## 2016-04-18 DIAGNOSIS — I252 Old myocardial infarction: Secondary | ICD-10-CM | POA: Insufficient documentation

## 2016-04-18 DIAGNOSIS — I4510 Unspecified right bundle-branch block: Secondary | ICD-10-CM | POA: Insufficient documentation

## 2016-04-18 DIAGNOSIS — F319 Bipolar disorder, unspecified: Secondary | ICD-10-CM | POA: Insufficient documentation

## 2016-04-18 DIAGNOSIS — F17210 Nicotine dependence, cigarettes, uncomplicated: Secondary | ICD-10-CM | POA: Diagnosis not present

## 2016-04-18 DIAGNOSIS — Z1211 Encounter for screening for malignant neoplasm of colon: Secondary | ICD-10-CM

## 2016-04-18 DIAGNOSIS — I10 Essential (primary) hypertension: Secondary | ICD-10-CM | POA: Insufficient documentation

## 2016-04-18 DIAGNOSIS — Z981 Arthrodesis status: Secondary | ICD-10-CM | POA: Insufficient documentation

## 2016-04-18 DIAGNOSIS — K635 Polyp of colon: Secondary | ICD-10-CM | POA: Diagnosis not present

## 2016-04-18 HISTORY — PX: COLONOSCOPY WITH PROPOFOL: SHX5780

## 2016-04-18 HISTORY — PX: POLYPECTOMY: SHX5525

## 2016-04-18 SURGERY — COLONOSCOPY WITH PROPOFOL
Anesthesia: Monitor Anesthesia Care

## 2016-04-18 MED ORDER — PROPOFOL 10 MG/ML IV BOLUS
INTRAVENOUS | Status: DC | PRN
  Administered 2016-04-18 (×3): 10 mg via INTRAVENOUS

## 2016-04-18 MED ORDER — FENTANYL CITRATE (PF) 100 MCG/2ML IJ SOLN
INTRAMUSCULAR | Status: DC | PRN
  Administered 2016-04-18 (×2): 25 ug via INTRAVENOUS

## 2016-04-18 MED ORDER — FENTANYL CITRATE (PF) 100 MCG/2ML IJ SOLN: 25.0000 ug | INTRAMUSCULAR | Status: DC | PRN

## 2016-04-18 MED ORDER — MIDAZOLAM HCL 2 MG/2ML IJ SOLN
INTRAMUSCULAR | Status: AC
  Filled 2016-04-18: qty 2

## 2016-04-18 MED ORDER — LIDOCAINE HCL (CARDIAC) 10 MG/ML IV SOLN
INTRAVENOUS | Status: DC | PRN
  Administered 2016-04-18: 8 mg via INTRAVENOUS
  Administered 2016-04-18: 50 mg via INTRAVENOUS

## 2016-04-18 MED ORDER — PROPOFOL 10 MG/ML IV BOLUS
INTRAVENOUS | Status: AC
  Filled 2016-04-18: qty 40

## 2016-04-18 MED ORDER — FENTANYL CITRATE (PF) 100 MCG/2ML IJ SOLN
INTRAMUSCULAR | Status: AC
  Filled 2016-04-18: qty 2

## 2016-04-18 MED ORDER — LIDOCAINE HCL (PF) 1 % IJ SOLN
INTRAMUSCULAR | Status: AC
  Filled 2016-04-18: qty 5

## 2016-04-18 MED ORDER — ONDANSETRON HCL 4 MG/2ML IJ SOLN: 4.0000 mg | Freq: Once | INTRAMUSCULAR | Status: DC | PRN

## 2016-04-18 MED ORDER — MIDAZOLAM HCL 5 MG/5ML IJ SOLN
INTRAMUSCULAR | Status: DC | PRN
  Administered 2016-04-18: 2 mg via INTRAVENOUS

## 2016-04-18 MED ORDER — ONDANSETRON HCL 4 MG/2ML IJ SOLN
4.0000 mg | Freq: Once | INTRAMUSCULAR | Status: AC
Start: 2016-04-18 — End: 2016-04-18
  Administered 2016-04-18: 4 mg via INTRAVENOUS

## 2016-04-18 MED ORDER — ONDANSETRON HCL 4 MG/2ML IJ SOLN
INTRAMUSCULAR | Status: AC
  Filled 2016-04-18: qty 2

## 2016-04-18 MED ORDER — PROPOFOL 500 MG/50ML IV EMUL
INTRAVENOUS | Status: DC | PRN
  Administered 2016-04-18: 70 ug/kg/min via INTRAVENOUS
  Administered 2016-04-18: 75 ug/kg/min via INTRAVENOUS

## 2016-04-18 MED ORDER — MIDAZOLAM HCL 2 MG/2ML IJ SOLN
1.0000 mg | INTRAMUSCULAR | Status: DC | PRN
  Filled 2016-04-18: qty 2

## 2016-04-18 MED ORDER — FENTANYL CITRATE (PF) 100 MCG/2ML IJ SOLN
25.0000 ug | INTRAMUSCULAR | Status: DC | PRN
Start: 2016-04-18 — End: 2016-04-18

## 2016-04-18 MED ORDER — LACTATED RINGERS IV SOLN
INTRAVENOUS | Status: DC
  Administered 2016-04-18: 07:00:00 via INTRAVENOUS

## 2016-04-18 NOTE — Discharge Instructions (Signed)
Resume Effient on 04/19/2016. Resume other medications and high fiber diet as before. No driving for 24 hours. Physician will call with biopsy results.  Colonoscopy, Care After Refer to this sheet in the next few weeks. These instructions provide you with information on caring for yourself after your procedure. Your health care provider may also give you more specific instructions. Your treatment has been planned according to current medical practices, but problems sometimes occur. Call your health care provider if you have any problems or questions after your procedure. WHAT TO EXPECT AFTER THE PROCEDURE  After your procedure, it is typical to have the following:  A small amount of blood in your stool.  Moderate amounts of gas and mild abdominal cramping or bloating. HOME CARE INSTRUCTIONS  Do not drive, operate machinery, or sign important documents for 24 hours.  You may shower and resume your regular physical activities, but move at a slower pace for the first 24 hours.  Take frequent rest periods for the first 24 hours.  Walk around or put a warm pack on your abdomen to help reduce abdominal cramping and bloating.  Drink enough fluids to keep your urine clear or pale yellow.  You may resume your normal diet as instructed by your health care provider. Avoid heavy or fried foods that are hard to digest.  Avoid drinking alcohol for 24 hours or as instructed by your health care provider.  Only take over-the-counter or prescription medicines as directed by your health care provider.  If a tissue sample (biopsy) was taken during your procedure:  Do not take aspirin or blood thinners for 7 days, or as instructed by your health care provider.  Do not drink alcohol for 7 days, or as instructed by your health care provider.  Eat soft foods for the first 24 hours. SEEK MEDICAL CARE IF: You have persistent spotting of blood in your stool 2-3 days after the procedure. SEEK IMMEDIATE  MEDICAL CARE IF:  You have more than a small spotting of blood in your stool.  You pass large blood clots in your stool.  Your abdomen is swollen (distended).  You have nausea or vomiting.  You have a fever.  You have increasing abdominal pain that is not relieved with medicine.   This information is not intended to replace advice given to you by your health care provider. Make sure you discuss any questions you have with your health care provider.   Document Released: 04/15/2004 Document Revised: 06/22/2013 Document Reviewed: 05/09/2013 Elsevier Interactive Patient Education 2016 Elsevier Inc.  Colon Polyps Polyps are lumps of extra tissue growing inside the body. Polyps can grow in the large intestine (colon). Most colon polyps are noncancerous (benign). However, some colon polyps can become cancerous over time. Polyps that are larger than a pea may be harmful. To be safe, caregivers remove and test all polyps. CAUSES  Polyps form when mutations in the genes cause your cells to grow and divide even though no more tissue is needed. RISK FACTORS There are a number of risk factors that can increase your chances of getting colon polyps. They include:  Being older than 50 years.  Family history of colon polyps or colon cancer.  Long-term colon diseases, such as colitis or Crohn disease.  Being overweight.  Smoking.  Being inactive.  Drinking too much alcohol. SYMPTOMS  Most small polyps do not cause symptoms. If symptoms are present, they may include:  Blood in the stool. The stool may look dark red or  black.  Constipation or diarrhea that lasts longer than 1 week. DIAGNOSIS People often do not know they have polyps until their caregiver finds them during a regular checkup. Your caregiver can use 4 tests to check for polyps:  Digital rectal exam. The caregiver wears gloves and feels inside the rectum. This test would find polyps only in the rectum.  Barium enema. The  caregiver puts a liquid called barium into your rectum before taking X-rays of your colon. Barium makes your colon look white. Polyps are dark, so they are easy to see in the X-ray pictures.  Sigmoidoscopy. A thin, flexible tube (sigmoidoscope) is placed into your rectum. The sigmoidoscope has a light and tiny camera in it. The caregiver uses the sigmoidoscope to look at the last third of your colon.  Colonoscopy. This test is like sigmoidoscopy, but the caregiver looks at the entire colon. This is the most common method for finding and removing polyps. TREATMENT  Any polyps will be removed during a sigmoidoscopy or colonoscopy. The polyps are then tested for cancer. PREVENTION  To help lower your risk of getting more colon polyps:  Eat plenty of fruits and vegetables. Avoid eating fatty foods.  Do not smoke.  Avoid drinking alcohol.  Exercise every day.  Lose weight if recommended by your caregiver.  Eat plenty of calcium and folate. Foods that are rich in calcium include milk, cheese, and broccoli. Foods that are rich in folate include chickpeas, kidney beans, and spinach. HOME CARE INSTRUCTIONS Keep all follow-up appointments as directed by your caregiver. You may need periodic exams to check for polyps. SEEK MEDICAL CARE IF: You notice bleeding during a bowel movement.   This information is not intended to replace advice given to you by your health care provider. Make sure you discuss any questions you have with your health care provider.   Document Released: 05/28/2004 Document Revised: 09/22/2014 Document Reviewed: 11/11/2011 Elsevier Interactive Patient Education [employer].

## 2016-04-18 NOTE — Anesthesia Postprocedure Evaluation (Signed)
Anesthesia Post Note  Patient: Christopher Conley  Procedure(s) Performed: Procedure(s) (LRB): COLONOSCOPY WITH PROPOFOL (N/A) POLYPECTOMY  Patient location during evaluation: PACU Anesthesia Type: MAC Level of consciousness: awake Pain management: pain level controlled Vital Signs Assessment: post-procedure vital signs reviewed and stable Respiratory status: spontaneous breathing Cardiovascular status: stable Anesthetic complications: no    Last Vitals:  Vitals:   04/18/16 0815 04/18/16 0821  BP:  (P) 124/73  Pulse:    Resp:  (P) 14  Temp: (P) 36.7 C (P) 36.7 C    Last Pain:  Vitals:   04/18/16 0646  TempSrc: Oral                 Aiysha Jillson

## 2016-04-18 NOTE — Op Note (Signed)
[facility] Patient Name: Christopher Conley Procedure Date: 04/18/2016 7:36 AM MRN: [id number] Date of Birth: February 14, 1956 Attending MD: Hildred Laser , MD CSN: [id number] Age: 60 Admit Type: Outpatient Procedure:                Colonoscopy Indications:              Screening for colorectal malignant neoplasm Providers:                Hildred Laser, MD, Lurline Del, RN, Purcell Nails.                            Tina Griffiths, Technician Referring MD:             Stoney Bang MD, MD Medicines:                Propofol per Anesthesia Complications:            No immediate complications. Estimated Blood Loss:     Estimated blood loss was minimal. Procedure:                Pre-Anesthesia Assessment:                           - Prior to the procedure, a History and Physical                            was performed, and patient medications and                            allergies were reviewed. The patient's tolerance of                            previous anesthesia was also reviewed. The risks                            and benefits of the procedure and the sedation                            options and risks were discussed with the patient.                            All questions were answered, and informed consent                            was obtained. Prior Anticoagulants: The patient                            last took anticoagulant medication 3 days prior to                            the procedure. ASA Grade Assessment: III - A                            patient with severe systemic disease. After  reviewing the risks and benefits, the patient was                            deemed in satisfactory condition to undergo the                            procedure.                           After obtaining informed consent, the colonoscope                            was passed under direct vision. Throughout the                            procedure, the patient's blood  pressure, pulse, and                            oxygen saturations were monitored continuously. The                            [id number]) was introduced through the                            anus and advanced to the the cecum, identified by                            appendiceal orifice and ileocecal valve. The                            colonoscopy was performed without difficulty. The                            patient tolerated the procedure well. The quality                            of the bowel preparation was fair. The ileocecal                            valve, appendiceal orifice, and rectum were                            photographed. Scope In: 7:46:28 AM Scope Out: 8:15:13 AM Scope Withdrawal Time: 0 hours 18 minutes 50 seconds  Total Procedure Duration: 0 hours 28 minutes 45 seconds  Findings:      Three sessile polyps were found in the sigmoid colon, splenic flexure       and transverse colon. The polyps were 4 to 7 mm in size. These polyps       were removed with a cold snare. Resection and retrieval were complete.       The pathology specimen was placed into Bottle Number 1. To prevent       bleeding post-intervention, one hemostatic clip was successfully placed       (MR conditional) to polypectomy site at splenic flexure.. There was no  bleeding at the end of the procedure.      The retroflexed view of the distal rectum and anal verge was normal and       showed no anal or rectal abnormalities. Impression:               - Preparation of the colon was fair.                           - Three 4 to 7 mm polyps in the sigmoid colon, at                            the splenic flexure and in the transverse colon,                            removed with a cold snare. Resected and retrieved.                            Clip (MR conditional) was placed at splenic flexure                            polypectomy site. Moderate Sedation:      Moderate (conscious)  sedation was personally administered by an       anesthesia professional. The following parameters were monitored: oxygen       saturation, heart rate, blood pressure, CO2 capnography and response to       care. Total physician intraservice time was 36 minutes. Recommendation:           - Patient has a contact number available for                            emergencies. The signs and symptoms of potential                            delayed complications were discussed with the                            patient. Return to normal activities tomorrow.                            Written discharge instructions were provided to the                            patient.                           - High fiber diet today.                           - Continue present medications.                           - Resume anticoagulant medication at prior dose                            tomorrow.                           -  Await pathology results.                           - Repeat colonoscopy is recommended. The                            colonoscopy date will be determined after pathology                            results from today's exam become available for                            review. Procedure Code(s):        --- Professional ---                           [phone], Colonoscopy, flexible; with removal of                            tumor(s), polyp(s), or other lesion(s) by snare                            technique Diagnosis Code(s):        --- Professional ---                           Z12.11, Encounter for screening for malignant                            neoplasm of colon                           D12.5, Benign neoplasm of sigmoid colon                           D12.3, Benign neoplasm of transverse colon (hepatic                            flexure or splenic flexure) CPT copyright 2016 American Medical Association. All rights reserved. The codes documented in this report are preliminary and  upon coder review may  be revised to meet current compliance requirements. Hildred Laser, MD Hildred Laser, MD 04/18/2016 8:23:22 AM This report has been signed electronically. Number of Addenda: 0

## 2016-04-18 NOTE — Anesthesia Procedure Notes (Signed)
Procedure Name: MAC Date/Time: 04/18/2016 7:34 AM Performed by: Vista Deck Pre-anesthesia Checklist: Patient identified, Emergency Drugs available, Suction available, Timeout performed and Patient being monitored Patient Re-evaluated:Patient Re-evaluated prior to inductionOxygen Delivery Method: Non-rebreather mask

## 2016-04-18 NOTE — Transfer of Care (Signed)
Immediate Anesthesia Transfer of Care Note  Patient: Christopher Conley  Procedure(s) Performed: Procedure(s) with comments: COLONOSCOPY WITH PROPOFOL (N/A) - 830 POLYPECTOMY - hepatic flexure, splenic flexuere,and sigmoid polypectomies  Patient Location: PACU  Anesthesia Type:MAC  Level of Consciousness: sedated and patient cooperative  Airway & Oxygen Therapy: Patient Spontanous Breathing and non-rebreather face mask  Post-op Assessment: Report given to RN, Post -op Vital signs reviewed and stable and Patient moving all extremities  Post vital signs: Reviewed and stable    Last Pain:  Vitals:   04/18/16 0646  TempSrc: Oral      Patients Stated Pain Goal: 8 ([id number])  Complications: No apparent anesthesia complications

## 2016-04-18 NOTE — Anesthesia Preprocedure Evaluation (Signed)
Anesthesia Evaluation  Patient identified by MRN, date of birth, ID band Patient awake    Reviewed: Allergy & Precautions, NPO status , Patient's Chart, lab work & pertinent test results  Airway Mallampati: II  TM Distance: >3 FB     Dental  (+) Edentulous Upper, Edentulous Lower   Pulmonary COPD, Current Smoker,    breath sounds clear to auscultation       Cardiovascular hypertension, Pt. on medications (-) angina+ CAD, + Past MI and + Cardiac Stents   Rhythm:Regular Rate:Normal     Neuro/Psych PSYCHIATRIC DISORDERS Bipolar Disorder    GI/Hepatic negative GI ROS,   Endo/Other    Renal/GU      Musculoskeletal   Abdominal   Peds  Hematology   Anesthesia Other Findings   Reproductive/Obstetrics                             Anesthesia Physical Anesthesia Plan  ASA: III  Anesthesia Plan: MAC   Post-op Pain Management:    Induction: Intravenous  Airway Management Planned: Simple Face Mask  Additional Equipment:   Intra-op Plan:   Post-operative Plan:   Informed Consent: I have reviewed the patients History and Physical, chart, labs and discussed the procedure including the risks, benefits and alternatives for the proposed anesthesia with the patient or authorized representative who has indicated his/her understanding and acceptance.     Plan Discussed with:   Anesthesia Plan Comments:         Anesthesia Quick Evaluation

## 2016-04-18 NOTE — H&P (Signed)
Christopher Conley is an 60 y.o. male.   Chief Complaint: Patient is here for colonoscopy. HPI: Patient is 60 year old Caucasian male with multiple medical problems was here for screening colonoscopy. He had an exam several years ago and was normal. Patient denies abdominal pain or rectal bleeding. He has constipation felt to be due to his medications. Patient states he is heavy smoker and concerned that he may have polyps. Family history is negative for CRC. He has been off Effient not for 3 days.  Past Medical History:  Diagnosis Date  . Arthritis   . Bipolar disorder ([place])   . COPD (chronic obstructive pulmonary disease) ([place])   . Coronary artery disease   . Hypertension   . Myocardial infarction [facility])     Past Surgical History:  Procedure Laterality Date  . ANTERIOR LAT LUMBAR FUSION  2008  . APPENDECTOMY    . CARDIAC CATHETERIZATION    . CORONARY ANGIOPLASTY    . HERNIA REPAIR     Umbilical    History reviewed. No pertinent family history. Social History:  reports that he has been smoking Cigarettes.  He has a 98.00 pack-year smoking history. He has never used smokeless tobacco. He reports that he does not drink alcohol or use drugs.  Allergies: No Known Allergies  Medications Prior to Admission  Medication Sig Dispense Refill  . acetaminophen (TYLENOL) 500 MG tablet Take 1,000 mg by mouth every 6 (six) hours as needed for mild pain.    Marland Kitchen EFFIENT 10 MG TABS tablet Take 10 mg by mouth daily.  1  . metoprolol (LOPRESSOR) 50 MG tablet Take 25 mg by mouth 2 (two) times daily.  0  . morphine (MSIR) 15 MG tablet Take 15 mg by mouth every 6 (six) hours as needed for moderate pain.   0  . polyethylene glycol-electrolytes (TRILYTE) 420 g solution Take 4,000 mLs by mouth once. 4000 mL 0  . simvastatin (ZOCOR) 80 MG tablet Take 80 mg by mouth daily.  0  . sulfamethoxazole-trimethoprim (BACTRIM DS,SEPTRA DS) 800-160 MG tablet Take 1 tablet by mouth 2 (two) times daily. Starting 04/13/2016 x  5 days for kidney infection.  0  . tamsulosin (FLOMAX) 0.4 MG CAPS capsule Take 0.4 mg by mouth daily.  1  . varenicline (CHANTIX) 1 MG tablet Take 1 mg by mouth 2 (two) times daily.    . VENTOLIN HFA 108 (90 Base) MCG/ACT inhaler Inhale 2 puffs into the lungs 4 (four) times daily. as directed  1    No results found for this or any previous visit (from the past 48 hour(s)). No results found.  ROS  Blood pressure (!) 150/80, pulse 70, temperature 97.5 F (36.4 C), temperature source Oral, resp. rate 19, height 5\' 8"  (1.727 m), weight 193 lb (87.5 kg), SpO2 97 %. Physical Exam  Constitutional: He appears well-developed and well-nourished.  HENT:  Mouth/Throat: Oropharynx is clear and moist.  Eyes: Conjunctivae are normal. No scleral icterus.  Neck: No thyromegaly present.  Cardiovascular: Normal rate, regular rhythm and normal heart sounds.   No murmur heard. Respiratory: Effort normal.  Scattered bilateral expiratory rhonchi noted.  GI: He exhibits no distension and no mass. There is no tenderness.  Musculoskeletal: He exhibits no edema.  Lymphadenopathy:    He has no cervical adenopathy.  Neurological: He is alert.  Skin: Skin is warm.     Assessment/Plan Average risk screening colonoscopy under monitored anesthesia care.  Hildred Laser, MD 04/18/2016, 7:21 AM

## 2016-04-21 DIAGNOSIS — I2510 Atherosclerotic heart disease of native coronary artery without angina pectoris: Secondary | ICD-10-CM | POA: Diagnosis not present

## 2016-04-21 DIAGNOSIS — E784 Other hyperlipidemia: Secondary | ICD-10-CM | POA: Diagnosis not present

## 2016-04-21 DIAGNOSIS — I10 Essential (primary) hypertension: Secondary | ICD-10-CM | POA: Diagnosis not present

## 2016-04-21 DIAGNOSIS — J441 Chronic obstructive pulmonary disease with (acute) exacerbation: Secondary | ICD-10-CM | POA: Diagnosis not present

## 2016-04-23 ENCOUNTER — Encounter (HOSPITAL_COMMUNITY): Payer: Self-pay | Admitting: Internal Medicine

## 2016-06-12 DIAGNOSIS — I10 Essential (primary) hypertension: Secondary | ICD-10-CM | POA: Diagnosis not present

## 2016-06-12 DIAGNOSIS — J441 Chronic obstructive pulmonary disease with (acute) exacerbation: Secondary | ICD-10-CM | POA: Diagnosis not present

## 2016-06-12 DIAGNOSIS — I2510 Atherosclerotic heart disease of native coronary artery without angina pectoris: Secondary | ICD-10-CM | POA: Diagnosis not present

## 2016-06-12 DIAGNOSIS — E784 Other hyperlipidemia: Secondary | ICD-10-CM | POA: Diagnosis not present

## 2016-06-12 DIAGNOSIS — Z6830 Body mass index (BMI) 30.0-30.9, adult: Secondary | ICD-10-CM | POA: Diagnosis not present

## 2016-06-18 DIAGNOSIS — Z125 Encounter for screening for malignant neoplasm of prostate: Secondary | ICD-10-CM | POA: Diagnosis not present

## 2016-06-18 DIAGNOSIS — I10 Essential (primary) hypertension: Secondary | ICD-10-CM | POA: Diagnosis not present

## 2016-06-18 DIAGNOSIS — E784 Other hyperlipidemia: Secondary | ICD-10-CM | POA: Diagnosis not present

## 2016-08-12 DIAGNOSIS — J40 Bronchitis, not specified as acute or chronic: Secondary | ICD-10-CM | POA: Diagnosis not present

## 2016-09-11 DIAGNOSIS — Z0000 Encounter for general adult medical examination without abnormal findings: Secondary | ICD-10-CM | POA: Diagnosis not present

## 2016-09-11 DIAGNOSIS — J441 Chronic obstructive pulmonary disease with (acute) exacerbation: Secondary | ICD-10-CM | POA: Diagnosis not present

## 2016-09-11 DIAGNOSIS — Z6829 Body mass index (BMI) 29.0-29.9, adult: Secondary | ICD-10-CM | POA: Diagnosis not present

## 2016-09-11 DIAGNOSIS — Z1389 Encounter for screening for other disorder: Secondary | ICD-10-CM | POA: Diagnosis not present

## 2016-09-11 DIAGNOSIS — I2510 Atherosclerotic heart disease of native coronary artery without angina pectoris: Secondary | ICD-10-CM | POA: Diagnosis not present

## 2016-09-11 DIAGNOSIS — I10 Essential (primary) hypertension: Secondary | ICD-10-CM | POA: Diagnosis not present

## 2016-09-11 DIAGNOSIS — E784 Other hyperlipidemia: Secondary | ICD-10-CM | POA: Diagnosis not present

## 2016-12-09 DIAGNOSIS — I10 Essential (primary) hypertension: Secondary | ICD-10-CM | POA: Diagnosis not present

## 2016-12-09 DIAGNOSIS — J441 Chronic obstructive pulmonary disease with (acute) exacerbation: Secondary | ICD-10-CM | POA: Diagnosis not present

## 2016-12-09 DIAGNOSIS — Z6831 Body mass index (BMI) 31.0-31.9, adult: Secondary | ICD-10-CM | POA: Diagnosis not present

## 2016-12-09 DIAGNOSIS — E784 Other hyperlipidemia: Secondary | ICD-10-CM | POA: Diagnosis not present

## 2016-12-09 DIAGNOSIS — I2510 Atherosclerotic heart disease of native coronary artery without angina pectoris: Secondary | ICD-10-CM | POA: Diagnosis not present

## 2017-02-16 DIAGNOSIS — N4289 Other specified disorders of prostate: Secondary | ICD-10-CM | POA: Diagnosis not present

## 2017-02-16 DIAGNOSIS — J441 Chronic obstructive pulmonary disease with (acute) exacerbation: Secondary | ICD-10-CM | POA: Diagnosis not present

## 2017-02-16 DIAGNOSIS — I2510 Atherosclerotic heart disease of native coronary artery without angina pectoris: Secondary | ICD-10-CM | POA: Diagnosis not present

## 2017-02-16 DIAGNOSIS — I10 Essential (primary) hypertension: Secondary | ICD-10-CM | POA: Diagnosis not present

## 2017-02-16 DIAGNOSIS — E784 Other hyperlipidemia: Secondary | ICD-10-CM | POA: Diagnosis not present

## 2017-02-16 DIAGNOSIS — Z6830 Body mass index (BMI) 30.0-30.9, adult: Secondary | ICD-10-CM | POA: Diagnosis not present

## 2017-05-20 DIAGNOSIS — J441 Chronic obstructive pulmonary disease with (acute) exacerbation: Secondary | ICD-10-CM | POA: Diagnosis not present

## 2017-05-20 DIAGNOSIS — N4289 Other specified disorders of prostate: Secondary | ICD-10-CM | POA: Diagnosis not present

## 2017-05-20 DIAGNOSIS — E784 Other hyperlipidemia: Secondary | ICD-10-CM | POA: Diagnosis not present

## 2017-05-20 DIAGNOSIS — I2510 Atherosclerotic heart disease of native coronary artery without angina pectoris: Secondary | ICD-10-CM | POA: Diagnosis not present

## 2017-05-20 DIAGNOSIS — Z6830 Body mass index (BMI) 30.0-30.9, adult: Secondary | ICD-10-CM | POA: Diagnosis not present

## 2017-05-20 DIAGNOSIS — I10 Essential (primary) hypertension: Secondary | ICD-10-CM | POA: Diagnosis not present

## 2017-08-18 DIAGNOSIS — N4289 Other specified disorders of prostate: Secondary | ICD-10-CM | POA: Diagnosis not present

## 2017-08-18 DIAGNOSIS — Z6830 Body mass index (BMI) 30.0-30.9, adult: Secondary | ICD-10-CM | POA: Diagnosis not present

## 2017-08-18 DIAGNOSIS — Z125 Encounter for screening for malignant neoplasm of prostate: Secondary | ICD-10-CM | POA: Diagnosis not present

## 2017-08-18 DIAGNOSIS — I10 Essential (primary) hypertension: Secondary | ICD-10-CM | POA: Diagnosis not present

## 2017-08-18 DIAGNOSIS — I2510 Atherosclerotic heart disease of native coronary artery without angina pectoris: Secondary | ICD-10-CM | POA: Diagnosis not present

## 2017-08-18 DIAGNOSIS — J441 Chronic obstructive pulmonary disease with (acute) exacerbation: Secondary | ICD-10-CM | POA: Diagnosis not present

## 2017-08-18 DIAGNOSIS — E7849 Other hyperlipidemia: Secondary | ICD-10-CM | POA: Diagnosis not present

## 2017-08-20 DIAGNOSIS — J9811 Atelectasis: Secondary | ICD-10-CM | POA: Diagnosis not present

## 2017-08-20 DIAGNOSIS — J449 Chronic obstructive pulmonary disease, unspecified: Secondary | ICD-10-CM | POA: Diagnosis not present

## 2017-08-20 DIAGNOSIS — R079 Chest pain, unspecified: Secondary | ICD-10-CM | POA: Diagnosis not present

## 2017-11-16 DIAGNOSIS — N4289 Other specified disorders of prostate: Secondary | ICD-10-CM | POA: Diagnosis not present

## 2017-11-16 DIAGNOSIS — Z6831 Body mass index (BMI) 31.0-31.9, adult: Secondary | ICD-10-CM | POA: Diagnosis not present

## 2017-11-16 DIAGNOSIS — J441 Chronic obstructive pulmonary disease with (acute) exacerbation: Secondary | ICD-10-CM | POA: Diagnosis not present

## 2017-11-16 DIAGNOSIS — I2510 Atherosclerotic heart disease of native coronary artery without angina pectoris: Secondary | ICD-10-CM | POA: Diagnosis not present

## 2017-11-16 DIAGNOSIS — Z0000 Encounter for general adult medical examination without abnormal findings: Secondary | ICD-10-CM | POA: Diagnosis not present

## 2017-11-16 DIAGNOSIS — Z1389 Encounter for screening for other disorder: Secondary | ICD-10-CM | POA: Diagnosis not present

## 2017-11-16 DIAGNOSIS — I10 Essential (primary) hypertension: Secondary | ICD-10-CM | POA: Diagnosis not present

## 2017-11-16 DIAGNOSIS — E7849 Other hyperlipidemia: Secondary | ICD-10-CM | POA: Diagnosis not present

## 2017-12-08 DIAGNOSIS — K2900 Acute gastritis without bleeding: Secondary | ICD-10-CM | POA: Diagnosis not present

## 2017-12-08 DIAGNOSIS — Z6830 Body mass index (BMI) 30.0-30.9, adult: Secondary | ICD-10-CM | POA: Diagnosis not present

## 2018-02-16 DIAGNOSIS — I10 Essential (primary) hypertension: Secondary | ICD-10-CM | POA: Diagnosis not present

## 2018-02-16 DIAGNOSIS — J441 Chronic obstructive pulmonary disease with (acute) exacerbation: Secondary | ICD-10-CM | POA: Diagnosis not present

## 2018-02-16 DIAGNOSIS — F411 Generalized anxiety disorder: Secondary | ICD-10-CM | POA: Diagnosis not present

## 2018-02-16 DIAGNOSIS — Z6831 Body mass index (BMI) 31.0-31.9, adult: Secondary | ICD-10-CM | POA: Diagnosis not present

## 2018-05-20 DIAGNOSIS — Z6831 Body mass index (BMI) 31.0-31.9, adult: Secondary | ICD-10-CM | POA: Diagnosis not present

## 2018-05-20 DIAGNOSIS — Z0000 Encounter for general adult medical examination without abnormal findings: Secondary | ICD-10-CM | POA: Diagnosis not present

## 2018-08-18 DIAGNOSIS — Z6832 Body mass index (BMI) 32.0-32.9, adult: Secondary | ICD-10-CM | POA: Diagnosis not present

## 2018-08-18 DIAGNOSIS — Z125 Encounter for screening for malignant neoplasm of prostate: Secondary | ICD-10-CM | POA: Diagnosis not present

## 2018-08-18 DIAGNOSIS — F418 Other specified anxiety disorders: Secondary | ICD-10-CM | POA: Diagnosis not present

## 2018-08-18 DIAGNOSIS — J449 Chronic obstructive pulmonary disease, unspecified: Secondary | ICD-10-CM | POA: Diagnosis not present

## 2018-08-18 DIAGNOSIS — I10 Essential (primary) hypertension: Secondary | ICD-10-CM | POA: Diagnosis not present

## 2018-08-19 DIAGNOSIS — R0602 Shortness of breath: Secondary | ICD-10-CM | POA: Diagnosis not present

## 2018-09-24 DIAGNOSIS — Z720 Tobacco use: Secondary | ICD-10-CM | POA: Diagnosis not present

## 2018-09-24 DIAGNOSIS — I2510 Atherosclerotic heart disease of native coronary artery without angina pectoris: Secondary | ICD-10-CM | POA: Diagnosis not present

## 2018-09-24 DIAGNOSIS — R69 Illness, unspecified: Secondary | ICD-10-CM | POA: Diagnosis not present

## 2018-09-24 DIAGNOSIS — Z955 Presence of coronary angioplasty implant and graft: Secondary | ICD-10-CM | POA: Diagnosis not present

## 2018-09-24 DIAGNOSIS — I252 Old myocardial infarction: Secondary | ICD-10-CM | POA: Diagnosis not present

## 2018-09-24 DIAGNOSIS — J441 Chronic obstructive pulmonary disease with (acute) exacerbation: Secondary | ICD-10-CM | POA: Diagnosis not present

## 2018-09-24 DIAGNOSIS — E7800 Pure hypercholesterolemia, unspecified: Secondary | ICD-10-CM | POA: Diagnosis not present

## 2018-09-24 DIAGNOSIS — J9811 Atelectasis: Secondary | ICD-10-CM | POA: Diagnosis not present

## 2018-09-24 DIAGNOSIS — I10 Essential (primary) hypertension: Secondary | ICD-10-CM | POA: Diagnosis not present

## 2018-09-24 DIAGNOSIS — Z79899 Other long term (current) drug therapy: Secondary | ICD-10-CM | POA: Diagnosis not present

## 2018-11-18 DIAGNOSIS — I10 Essential (primary) hypertension: Secondary | ICD-10-CM | POA: Diagnosis not present

## 2018-11-18 DIAGNOSIS — J449 Chronic obstructive pulmonary disease, unspecified: Secondary | ICD-10-CM | POA: Diagnosis not present

## 2018-11-18 DIAGNOSIS — R69 Illness, unspecified: Secondary | ICD-10-CM | POA: Diagnosis not present

## 2018-11-18 DIAGNOSIS — Z0000 Encounter for general adult medical examination without abnormal findings: Secondary | ICD-10-CM | POA: Diagnosis not present

## 2018-11-18 DIAGNOSIS — Z131 Encounter for screening for diabetes mellitus: Secondary | ICD-10-CM | POA: Diagnosis not present

## 2018-11-18 DIAGNOSIS — Z6830 Body mass index (BMI) 30.0-30.9, adult: Secondary | ICD-10-CM | POA: Diagnosis not present

## 2018-11-18 DIAGNOSIS — Z1389 Encounter for screening for other disorder: Secondary | ICD-10-CM | POA: Diagnosis not present

## 2018-11-18 DIAGNOSIS — R7303 Prediabetes: Secondary | ICD-10-CM | POA: Diagnosis not present

## 2019-02-14 DIAGNOSIS — R69 Illness, unspecified: Secondary | ICD-10-CM | POA: Diagnosis not present

## 2019-02-22 DIAGNOSIS — I10 Essential (primary) hypertension: Secondary | ICD-10-CM | POA: Diagnosis not present

## 2019-02-22 DIAGNOSIS — Z6830 Body mass index (BMI) 30.0-30.9, adult: Secondary | ICD-10-CM | POA: Diagnosis not present

## 2019-02-22 DIAGNOSIS — J449 Chronic obstructive pulmonary disease, unspecified: Secondary | ICD-10-CM | POA: Diagnosis not present

## 2019-02-22 DIAGNOSIS — R69 Illness, unspecified: Secondary | ICD-10-CM | POA: Diagnosis not present

## 2019-03-21 DIAGNOSIS — H35031 Hypertensive retinopathy, right eye: Secondary | ICD-10-CM | POA: Diagnosis not present

## 2019-04-12 DIAGNOSIS — H524 Presbyopia: Secondary | ICD-10-CM | POA: Diagnosis not present

## 2019-05-31 DIAGNOSIS — R69 Illness, unspecified: Secondary | ICD-10-CM | POA: Diagnosis not present

## 2019-05-31 DIAGNOSIS — I10 Essential (primary) hypertension: Secondary | ICD-10-CM | POA: Diagnosis not present

## 2019-05-31 DIAGNOSIS — J449 Chronic obstructive pulmonary disease, unspecified: Secondary | ICD-10-CM | POA: Diagnosis not present

## 2019-05-31 DIAGNOSIS — Z6831 Body mass index (BMI) 31.0-31.9, adult: Secondary | ICD-10-CM | POA: Diagnosis not present

## 2019-07-27 DIAGNOSIS — R0602 Shortness of breath: Secondary | ICD-10-CM | POA: Diagnosis not present

## 2019-07-27 DIAGNOSIS — I10 Essential (primary) hypertension: Secondary | ICD-10-CM | POA: Diagnosis not present

## 2019-07-27 DIAGNOSIS — M7989 Other specified soft tissue disorders: Secondary | ICD-10-CM | POA: Diagnosis not present

## 2019-07-27 DIAGNOSIS — R2242 Localized swelling, mass and lump, left lower limb: Secondary | ICD-10-CM | POA: Diagnosis not present

## 2019-07-27 DIAGNOSIS — I252 Old myocardial infarction: Secondary | ICD-10-CM | POA: Diagnosis not present

## 2019-07-27 DIAGNOSIS — J449 Chronic obstructive pulmonary disease, unspecified: Secondary | ICD-10-CM | POA: Diagnosis not present

## 2019-07-27 DIAGNOSIS — Z955 Presence of coronary angioplasty implant and graft: Secondary | ICD-10-CM | POA: Diagnosis not present

## 2019-07-27 DIAGNOSIS — I2510 Atherosclerotic heart disease of native coronary artery without angina pectoris: Secondary | ICD-10-CM | POA: Diagnosis not present

## 2019-07-27 DIAGNOSIS — E7800 Pure hypercholesterolemia, unspecified: Secondary | ICD-10-CM | POA: Diagnosis not present

## 2019-07-27 DIAGNOSIS — I493 Ventricular premature depolarization: Secondary | ICD-10-CM | POA: Diagnosis not present

## 2019-07-27 DIAGNOSIS — Z720 Tobacco use: Secondary | ICD-10-CM | POA: Diagnosis not present

## 2019-07-27 DIAGNOSIS — R69 Illness, unspecified: Secondary | ICD-10-CM | POA: Diagnosis not present

## 2019-07-27 DIAGNOSIS — Z7901 Long term (current) use of anticoagulants: Secondary | ICD-10-CM | POA: Diagnosis not present

## 2019-08-31 DIAGNOSIS — R69 Illness, unspecified: Secondary | ICD-10-CM | POA: Diagnosis not present

## 2019-08-31 DIAGNOSIS — Z6830 Body mass index (BMI) 30.0-30.9, adult: Secondary | ICD-10-CM | POA: Diagnosis not present

## 2019-08-31 DIAGNOSIS — J449 Chronic obstructive pulmonary disease, unspecified: Secondary | ICD-10-CM | POA: Diagnosis not present

## 2019-08-31 DIAGNOSIS — I10 Essential (primary) hypertension: Secondary | ICD-10-CM | POA: Diagnosis not present

## 2019-09-02 DIAGNOSIS — R600 Localized edema: Secondary | ICD-10-CM | POA: Diagnosis not present

## 2019-09-02 DIAGNOSIS — R2242 Localized swelling, mass and lump, left lower limb: Secondary | ICD-10-CM | POA: Diagnosis not present

## 2019-09-02 DIAGNOSIS — M79605 Pain in left leg: Secondary | ICD-10-CM | POA: Diagnosis not present

## 2019-11-05 DIAGNOSIS — R69 Illness, unspecified: Secondary | ICD-10-CM | POA: Diagnosis not present

## 2019-12-06 DIAGNOSIS — I10 Essential (primary) hypertension: Secondary | ICD-10-CM | POA: Diagnosis not present

## 2019-12-06 DIAGNOSIS — R69 Illness, unspecified: Secondary | ICD-10-CM | POA: Diagnosis not present

## 2019-12-06 DIAGNOSIS — Z1331 Encounter for screening for depression: Secondary | ICD-10-CM | POA: Diagnosis not present

## 2019-12-06 DIAGNOSIS — Z125 Encounter for screening for malignant neoplasm of prostate: Secondary | ICD-10-CM | POA: Diagnosis not present

## 2019-12-06 DIAGNOSIS — J449 Chronic obstructive pulmonary disease, unspecified: Secondary | ICD-10-CM | POA: Diagnosis not present

## 2019-12-06 DIAGNOSIS — Z6830 Body mass index (BMI) 30.0-30.9, adult: Secondary | ICD-10-CM | POA: Diagnosis not present

## 2019-12-06 DIAGNOSIS — Z0000 Encounter for general adult medical examination without abnormal findings: Secondary | ICD-10-CM | POA: Diagnosis not present

## 2020-02-14 DIAGNOSIS — R69 Illness, unspecified: Secondary | ICD-10-CM | POA: Diagnosis not present

## 2020-02-29 DIAGNOSIS — R69 Illness, unspecified: Secondary | ICD-10-CM | POA: Diagnosis not present

## 2020-02-29 DIAGNOSIS — I10 Essential (primary) hypertension: Secondary | ICD-10-CM | POA: Diagnosis not present

## 2020-02-29 DIAGNOSIS — Z6832 Body mass index (BMI) 32.0-32.9, adult: Secondary | ICD-10-CM | POA: Diagnosis not present

## 2020-02-29 DIAGNOSIS — J449 Chronic obstructive pulmonary disease, unspecified: Secondary | ICD-10-CM | POA: Diagnosis not present

## 2020-03-29 DIAGNOSIS — Z0389 Encounter for observation for other suspected diseases and conditions ruled out: Secondary | ICD-10-CM | POA: Diagnosis not present

## 2020-03-29 DIAGNOSIS — M810 Age-related osteoporosis without current pathological fracture: Secondary | ICD-10-CM | POA: Diagnosis not present

## 2020-04-27 DIAGNOSIS — R69 Illness, unspecified: Secondary | ICD-10-CM | POA: Diagnosis not present

## 2020-05-31 DIAGNOSIS — Z136 Encounter for screening for cardiovascular disorders: Secondary | ICD-10-CM | POA: Diagnosis not present

## 2020-05-31 DIAGNOSIS — J449 Chronic obstructive pulmonary disease, unspecified: Secondary | ICD-10-CM | POA: Diagnosis not present

## 2020-05-31 DIAGNOSIS — I10 Essential (primary) hypertension: Secondary | ICD-10-CM | POA: Diagnosis not present

## 2020-05-31 DIAGNOSIS — Z6831 Body mass index (BMI) 31.0-31.9, adult: Secondary | ICD-10-CM | POA: Diagnosis not present

## 2020-05-31 DIAGNOSIS — R69 Illness, unspecified: Secondary | ICD-10-CM | POA: Diagnosis not present

## 2020-05-31 DIAGNOSIS — Z87891 Personal history of nicotine dependence: Secondary | ICD-10-CM | POA: Diagnosis not present

## 2020-06-28 DIAGNOSIS — R69 Illness, unspecified: Secondary | ICD-10-CM | POA: Diagnosis not present

## 2020-06-28 DIAGNOSIS — J439 Emphysema, unspecified: Secondary | ICD-10-CM | POA: Diagnosis not present

## 2020-06-28 DIAGNOSIS — I700 Atherosclerosis of aorta: Secondary | ICD-10-CM | POA: Diagnosis not present

## 2020-06-28 DIAGNOSIS — R918 Other nonspecific abnormal finding of lung field: Secondary | ICD-10-CM | POA: Diagnosis not present

## 2020-07-31 ENCOUNTER — Other Ambulatory Visit: Payer: Self-pay

## 2020-07-31 ENCOUNTER — Encounter: Payer: Self-pay | Admitting: Internal Medicine

## 2020-07-31 ENCOUNTER — Ambulatory Visit (INDEPENDENT_AMBULATORY_CARE_PROVIDER_SITE_OTHER): Payer: Medicare HMO | Admitting: Internal Medicine

## 2020-07-31 VITALS — BP 126/78 | HR 76 | Temp 98.1°F | Ht 68.0 in | Wt 217.8 lb

## 2020-07-31 DIAGNOSIS — R918 Other nonspecific abnormal finding of lung field: Secondary | ICD-10-CM

## 2020-07-31 DIAGNOSIS — F17210 Nicotine dependence, cigarettes, uncomplicated: Secondary | ICD-10-CM

## 2020-07-31 DIAGNOSIS — I2510 Atherosclerotic heart disease of native coronary artery without angina pectoris: Secondary | ICD-10-CM | POA: Insufficient documentation

## 2020-07-31 DIAGNOSIS — Z23 Encounter for immunization: Secondary | ICD-10-CM | POA: Diagnosis not present

## 2020-07-31 DIAGNOSIS — R69 Illness, unspecified: Secondary | ICD-10-CM | POA: Diagnosis not present

## 2020-07-31 DIAGNOSIS — J410 Simple chronic bronchitis: Secondary | ICD-10-CM

## 2020-07-31 DIAGNOSIS — J441 Chronic obstructive pulmonary disease with (acute) exacerbation: Secondary | ICD-10-CM

## 2020-07-31 MED ORDER — BUPROPION HCL ER (SR) 150 MG PO TB12: 150.0000 mg | ORAL_TABLET | Freq: Two times a day (BID) | ORAL | 3 refills | Status: DC

## 2020-07-31 NOTE — Progress Notes (Signed)
Christopher Conley    [id number]    07-28-1956  Primary Care Physician:Hasanaj, Samul Dada, MD  Referring Physician: Neale Burly, MD [address] [place],  [place] [address] Reason for Consultation: abnormal CT Chest Date of Consultation: 07/31/2020  Chief complaint:   Chief Complaint  Patient presents with  . Consult    pt is here to go ct results     HPI:  DODGE ATOR is a 64 y.o. gentleman who presents for new patient evaluation of pulmonary nodules.   Had LDCT for lung cancer screening. Here for follow up. Occasional cough and shortness of breath. He has an inhaler and nebulizer at home. Never been hospitalized for his breathing. Last prescribed prednisone once in the last 6 months.  No fevers, chills, night sweats or weight loss. Has dyspnea on exertion and difficulty breathing. Smokes due to depression and anxiety after suffering loss of his son and both parents. Here with his brother today.    Social history:  Occupation: retired from being a Development worker, community.  Exposures: asbestos exposure. Lives at home independently. No pets.  Smoking history: almost 100 pack years, trying to cut back.   Social History   Occupational History  . Not on file  Tobacco Use  . Smoking status: Current Every Day Smoker    Packs/day: 2.00    Years: 49.00    Pack years: 98.00    Types: Cigarettes    Start date: 28  . Smokeless tobacco: Never Used  Substance and Sexual Activity  . Alcohol use: No  . Drug use: No  . Sexual activity: Not on file    Relevant family history:  Family History  Problem Relation Age of Onset  . CAD Brother   . COPD Brother     Past Medical History:  Diagnosis Date  . Arthritis   . Bipolar disorder ([place])   . COPD (chronic obstructive pulmonary disease) ([place])   . Coronary artery disease   . Hypertension   . Myocardial infarction [facility])     Past Surgical History:  Procedure Laterality Date  . ANTERIOR LAT LUMBAR FUSION  2008  .  APPENDECTOMY    . CARDIAC CATHETERIZATION    . COLONOSCOPY WITH PROPOFOL N/A 04/18/2016   Procedure: COLONOSCOPY WITH PROPOFOL;  Surgeon: Rogene Houston, MD;  Location: [facility];  Service: Endoscopy;  Laterality: N/A;  830  . CORONARY ANGIOPLASTY    . HERNIA REPAIR     Umbilical  . POLYPECTOMY  04/18/2016   Procedure: POLYPECTOMY;  Surgeon: Rogene Houston, MD;  Location: [facility];  Service: Endoscopy;;  hepatic flexure, splenic flexuere,and sigmoid polypectomies     Physical Exam: Blood pressure 126/78, pulse 76, temperature 98.1 F (36.7 C), temperature source Oral, height 5\' 8"  (1.727 m), weight 217 lb 12.8 oz (98.8 kg), SpO2 98 %. Gen:      No acute distress ENT:  no nasal polyps, mucus membranes moist Lungs:    No increased respiratory effort, symmetric chest wall excursion, diminished, clear to auscultation bilaterally, no wheezes or crackles CV:         Regular rate and rhythm; no murmurs, rubs, or gallops.  No pedal edema Abd:      + bowel sounds; soft, non-tender; no distension MSK: no acute synovitis of DIP or PIP joints, no mechanics hands.  Skin:      Warm and dry; no rashes Neuro: normal speech, no focal facial asymmetry  Psych: alert and oriented x3, normal mood and affect   Data Reviewed/Medical Decision Making:  Independent interpretation of tests: Imaging: . Review of patient's CT images revealed multiple pulmonary nodules, the larges in the RML about 2 cm in diameter. The patient's images have been independently reviewed by me.    PFTs: None on file  Labs:  Lab Results  Component Value Date   WBC 9.2 04/15/2016   HGB 15.3 04/15/2016   HCT 45.5 04/15/2016   MCV 91.9 04/15/2016   PLT 212 04/15/2016     Immunization status:  Immunization History  Administered Date(s) Administered  . Influenza, High Dose Seasonal PF 07/31/2020  . PFIZER SARS-COV-2 Vaccination 05/04/2020, 05/31/2020    . I reviewed prior external note(s) from primary care . I  reviewed the result(s) of the labs and imaging as noted above.  . I have ordered CT Chest  Discussion of management or test interpretation with another colleague.   Assessment:  Multiple Pulmonary Nodules - largest 20 mm in diameter in a high risk patient  Tobacco Use disorder COPD - new diagnosis  Plan/Recommendations: Based on Fleischner society guidelines will repeat CT scan in 3 months in January 2022.  Discussed possibility of PET scan and possible biopsy if there is persistence or increase. Did extensive education on COPD today. He is interested in quitting smoking. Will try wellbutrin.  Continue albuterol inhalers for now. May need maintenance in future. Will obtain PFTs.  I personally spent  10 minutes counseling the patient regarding tobacco use disorder.  Patient is symptomatic from tobacco use disorder due to the following condition: COPD.  The patient's response was contemplative.  We discussed nicotine replacement therapy, Wellbutrin, .  We identified to gather patient specific barriers to change.  The patient is open to future discussions about tobacco cessation.    Fleischner Society Guidelines 2017 MacMahon H, Naidich DP, Goo JM, et al. Guidelines for management of incidental pulmonary nodules detected on CT Images: From the Fleischner Society. Radiology 2017; [id number]. Copyright  2017 Radiological Society of [place].  Evaluation of the incidental solid pulmonary nodule in adults Nodule size (mm) Low (<5%) cancer risk High (>65%) or moderate (5 to 65%) cancer risk  Solitary  <6 No routine follow-up Optional CT at 12 months  6 to 8 CT at 6 to 12 months, then consider CT at 18 to 24 months CT at 6 to 12 months, then CT at 18 to 24 months  >8 CT at 3 months, then at 9 and 24 months FDG PET/CT, biopsy or resection  Multiple (evaluation based on largest nodule)  <6 No routine follow-up Optional CT at 12 months  ?6 CT at 3 to 6 months, then consider CT at 18 to 24  months CT at 3 to 6 months, then CT at 18 to 24 months    Not applicable to patients age <35 years, in lung cancer screening, with immunosuppression, known pulmonary disease or symptoms or active primary cancer.  Chest CT performed without contrast as contiguous 1 mm sections using low dose technique.  Growing or FDG-avid nodules should undergo biopsy or resection. Growth is defined as >1.5 mm increase.  Nodules unchanged for >2 years are benign.   CT: computed tomography; FDG: 18-fluorodeoxyglucose; PET: positron emission tomography.  We discussed disease management and progression at length today.   I spent 60 minutes in the care of this patient today including pre-charting, chart review, review of results, face-to-face care, coordination of care and communication with  consultants etc.). Given the risk of malignancy and repeat imaging this note reflects my highest level of medical decision making   Return to Care: Return in about 2 months (around 09/30/2020).  Lenice Llamas, MD Pulmonary and [place]   CC: Neale Burly, MD

## 2020-07-31 NOTE — Patient Instructions (Addendum)
The patient should have follow up scheduled with myself in 3 months.   Prior to next visit patient should have: Full set of PFTs CT Chest  Bupropion -- Bupropion (brand names: Zyban, Wellbutrin) is an antidepressant that can be used to help you stop smoking.  Start taking it once per day for three days, then increase to twice daily starting four weeks before the quit date.  You should typically continue for 7 to 12 weeks after you quit smoking.  Please do not stop taking this medication abruptly.  When you are ready to stop we will have you go to once daily for two weeks and then you can do once every other day for a week before you stop.   Bupropion is generally well-tolerated, but it may cause dry mouth and difficulty sleeping. The drug should not be used by people who have a seizure disorder or bipolar (manic-depressive) disorder, and it is not recommended for those who have head trauma, anorexia nervosa, or bulimia, or for those who drink alcohol excessively.  What are the benefits of quitting smoking? Quitting smoking can lower your chances of getting or dying from heart disease, lung disease, kidney failure, infection, or cancer. It can also lower your chances of getting osteoporosis, a condition that makes your bones weak. Plus, quitting smoking can help your skin look younger and reduce the chances that you will have problems with sex.  Quitting smoking will improve your health no matter how old you are, and no matter how long or how much you have smoked.  What should I do if I want to quit smoking? The letters in the word "START" can help you remember the steps to take: S = Set a quit date. T = Tell family, friends, and the people around you that you plan to quit. A = Anticipate or plan ahead for the tough times you'll face while quitting. R = Remove cigarettes and other tobacco products from your home, car, and work. T = Talk to your doctor about getting help to quit.  How can my  doctor or nurse help? Your doctor or nurse can give you advice on the best way to quit. He or she can also put you in touch with counselors or other people you can call for support. Plus, your doctor or nurse can give you medicines to: ?Reduce your craving for cigarettes ?Reduce the unpleasant symptoms that happen when you stop smoking (called "withdrawal symptoms"). You can also get help from a free phone line (1-800-QUIT-NOW) or go online to [employer].  What are the symptoms of withdrawal? The symptoms include: ?Trouble sleeping ?Being irritable, anxious or restless ?Getting frustrated or angry ?Having trouble thinking clearly  Some people who stop smoking become temporarily depressed. Some people need treatment for depression, such as counseling or antidepressant medicines. Depressed people might: ?No longer enjoy or care about doing the things they used to like to do ?Feel sad, down, hopeless, nervous, or cranky most of the day, almost every day ?Lose or gain weight ?Sleep too much or too little ?Feel tired or like they have no energy ?Feel guilty or like they are worth nothing ?Forget things or feel confused ?Move and speak more slowly than usual ?Act restless or have trouble staying still ?Think about death or suicide  If you think you might be depressed, see your doctor or nurse. Only someone trained in mental health can tell for sure if you are depressed. If you ever feel like you might  hurt yourself, go straight to the nearest emergency department. Or you can call for an ambulance (in the [place] and [place], [place] 9-1-1) or call your doctor or nurse right away and tell them it is an emergency. You can also reach the [place] National Suicide Prevention Lifeline at 743-609-3995 or http://walker-sanchez.info/.  How do medicines help you stop smoking? Different medicines work in different ways: ?Nicotine replacement therapy eases withdrawal and reduces your body's craving for  nicotine, the main drug found in cigarettes. There are different forms of nicotine replacement, including skin patches, lozenges, gum, nasal sprays, and "puffers" or inhalers. Many can be bought without a prescription, while others might require one. ?Bupropion is a prescription medicine that reduces your desire to smoke. This medicine is sold under the brand names Zyban and Wellbutrin. It is also available in a generic version, which is cheaper than brand name medicines. ?Varenicline (brand names: Chantix, Champix) is a prescription medicine that reduces withdrawal symptoms and cigarette cravings. If you think you'd like to take varenicline and you have a history of depression, anxiety, or heart disease, discuss this with your doctor or nurse before taking the medicine. Varenicline can also increase the effects of alcohol in some people. It's a good idea to limit drinking while you're taking it, at least until you know how it affects you.  How does counseling work? Counseling can happen during formal office visits or just over the phone. A counselor can help you: ?Figure out what triggers your smoking and what to do instead ?Overcome cravings ?Figure out what went wrong when you tried to quit before  What works best? Studies show that people have the best luck at quitting if they take medicines to help them quit and work with a Social worker. It might also be helpful to combine nicotine replacement with one of the prescription medicines that help people quit. In some cases, it might even make sense to take bupropion and varenicline together.  What about e-cigarettes? Sometimes people wonder if using electronic cigarettes, or "e-cigarettes," might help them quit smoking. Using e-cigarettes is also called "vaping." Doctors do not recommend e-cigarettes in place of medicines and counseling. That's because e-cigarettes still contain nicotine as well as other substances that might be harmful. It's not clear  how they can affect a person's health in the long term.  Will I gain weight if I quit? Yes, you might gain a few pounds. But quitting smoking will have a much more positive effect on your health than weighing a few pounds more. Plus, you can help prevent some weight gain by being more active and eating less. Taking the medicine bupropion might help control weight gain.   What else can I do to improve my chances of quitting? You can: ?Start exercising. ?Stay away from smokers and places that you associate with smoking. If people close to you smoke, ask them to quit with you. ?Keep gum, hard candy, or something to put in your mouth handy. If you get a craving for a cigarette, try one of these instead. ?Don't give up, even if you start smoking again. It takes most people a few tries before they succeed.  What if I am pregnant and I smoke? If you are pregnant, it's really important for the health of your baby that you quit. Ask your doctor what options you have, and what is safest for your baby

## 2020-08-30 DIAGNOSIS — R69 Illness, unspecified: Secondary | ICD-10-CM | POA: Diagnosis not present

## 2020-09-03 DIAGNOSIS — R69 Illness, unspecified: Secondary | ICD-10-CM | POA: Diagnosis not present

## 2020-09-03 DIAGNOSIS — J449 Chronic obstructive pulmonary disease, unspecified: Secondary | ICD-10-CM | POA: Diagnosis not present

## 2020-09-03 DIAGNOSIS — Z6832 Body mass index (BMI) 32.0-32.9, adult: Secondary | ICD-10-CM | POA: Diagnosis not present

## 2020-09-03 DIAGNOSIS — I10 Essential (primary) hypertension: Secondary | ICD-10-CM | POA: Diagnosis not present

## 2020-09-27 NOTE — Progress Notes (Signed)
Tried calling patient multiple times to change appointment due to late opening for inclement weather on 10/01/20 but could never reach him. Called daughter and left message stating appointment has been changed to 10/04/20 @ 2:30pm. Left callback number and stated that if that appointment time does not work that I will be happy to change it for them.

## 2020-10-01 ENCOUNTER — Other Ambulatory Visit: Payer: Medicare HMO

## 2020-10-04 ENCOUNTER — Inpatient Hospital Stay
Admission: RE | Admit: 2020-10-04 | Discharge: 2020-10-04 | Disposition: A | Discharge status: Home / Self Care | Payer: Self-pay | Source: Ambulatory Visit | Attending: Internal Medicine | Admitting: Internal Medicine

## 2020-10-05 ENCOUNTER — Telehealth: Payer: Self-pay | Admitting: Internal Medicine

## 2020-10-05 NOTE — Telephone Encounter (Signed)
pt is calling because he would like to get his CT scan scheduled sooner than 3 weeks out. Pt feels like he's not important & thats why his apt was scheduled 3 weeks out . I adivse the pt that wasnt the case, & someone will reach out to see if something could be scheduled sooner  [phone]

## 2020-10-05 NOTE — Telephone Encounter (Signed)
Called and spoke with pt letting him know that the CT was scheduled first avail. Stated to him once he has CT done, Dr. Shearon Stalls will review results and we will then let him know what the results are. Pt verbalized understanding. Nothing further needed.

## 2020-10-25 ENCOUNTER — Ambulatory Visit (HOSPITAL_COMMUNITY)
Admission: RE | Admit: 2020-10-25 | Discharge: 2020-10-25 | Disposition: A | Discharge status: Home / Self Care | Payer: Medicare HMO | Source: Ambulatory Visit | Attending: Internal Medicine | Admitting: Internal Medicine

## 2020-10-25 ENCOUNTER — Other Ambulatory Visit: Payer: Self-pay

## 2020-10-25 ENCOUNTER — Encounter (HOSPITAL_COMMUNITY): Payer: Self-pay

## 2020-10-25 DIAGNOSIS — R918 Other nonspecific abnormal finding of lung field: Secondary | ICD-10-CM | POA: Insufficient documentation

## 2020-10-25 DIAGNOSIS — J410 Simple chronic bronchitis: Secondary | ICD-10-CM

## 2020-10-25 DIAGNOSIS — I2510 Atherosclerotic heart disease of native coronary artery without angina pectoris: Secondary | ICD-10-CM | POA: Diagnosis not present

## 2020-10-25 DIAGNOSIS — I898 Other specified noninfective disorders of lymphatic vessels and lymph nodes: Secondary | ICD-10-CM | POA: Diagnosis not present

## 2020-10-25 DIAGNOSIS — J929 Pleural plaque without asbestos: Secondary | ICD-10-CM | POA: Diagnosis not present

## 2020-10-25 DIAGNOSIS — J9811 Atelectasis: Secondary | ICD-10-CM | POA: Diagnosis not present

## 2020-11-02 ENCOUNTER — Other Ambulatory Visit: Payer: Self-pay | Admitting: *Deleted

## 2020-11-02 DIAGNOSIS — R918 Other nonspecific abnormal finding of lung field: Secondary | ICD-10-CM

## 2020-11-05 ENCOUNTER — Ambulatory Visit (INDEPENDENT_AMBULATORY_CARE_PROVIDER_SITE_OTHER): Payer: Medicare HMO | Admitting: Internal Medicine

## 2020-11-05 ENCOUNTER — Encounter: Payer: Self-pay | Admitting: Internal Medicine

## 2020-11-05 ENCOUNTER — Other Ambulatory Visit: Payer: Self-pay

## 2020-11-05 ENCOUNTER — Ambulatory Visit: Payer: Medicare HMO | Admitting: Internal Medicine

## 2020-11-05 VITALS — BP 130/80 | HR 77 | Temp 98.6°F | Ht 68.0 in | Wt 222.0 lb

## 2020-11-05 DIAGNOSIS — F17200 Nicotine dependence, unspecified, uncomplicated: Secondary | ICD-10-CM | POA: Diagnosis not present

## 2020-11-05 DIAGNOSIS — J449 Chronic obstructive pulmonary disease, unspecified: Secondary | ICD-10-CM | POA: Diagnosis not present

## 2020-11-05 DIAGNOSIS — J441 Chronic obstructive pulmonary disease with (acute) exacerbation: Secondary | ICD-10-CM

## 2020-11-05 DIAGNOSIS — R911 Solitary pulmonary nodule: Secondary | ICD-10-CM

## 2020-11-05 DIAGNOSIS — R69 Illness, unspecified: Secondary | ICD-10-CM | POA: Diagnosis not present

## 2020-11-05 DIAGNOSIS — R918 Other nonspecific abnormal finding of lung field: Secondary | ICD-10-CM | POA: Diagnosis not present

## 2020-11-05 DIAGNOSIS — F419 Anxiety disorder, unspecified: Secondary | ICD-10-CM | POA: Diagnosis not present

## 2020-11-05 LAB — PULMONARY FUNCTION TEST
DL/VA % pred: 100 %
DL/VA: 4.19 ml/min/mmHg/L
DLCO cor % pred: 73 %
DLCO cor: 18.38 ml/min/mmHg
DLCO unc % pred: 73 %
DLCO unc: 18.38 ml/min/mmHg
FEF 25-75 Post: 1.16 L/sec
FEF 25-75 Pre: 0.61 L/sec
FEF2575-%Change-Post: 90 %
FEF2575-%Pred-Post: 45 %
FEF2575-%Pred-Pre: 23 %
FEV1-%Change-Post: 24 %
FEV1-%Pred-Post: 55 %
FEV1-%Pred-Pre: 44 %
FEV1-Post: 1.76 L
FEV1-Pre: 1.42 L
FEV1FVC-%Change-Post: 1 %
FEV1FVC-%Pred-Pre: 73 %
FEV6-%Change-Post: 19 %
FEV6-%Pred-Post: 76 %
FEV6-%Pred-Pre: 64 %
FEV6-Post: 3.09 L
FEV6-Pre: 2.59 L
FEV6FVC-%Change-Post: -2 %
FEV6FVC-%Pred-Post: 102 %
FEV6FVC-%Pred-Pre: 105 %
FVC-%Change-Post: 22 %
FVC-%Pred-Post: 74 %
FVC-%Pred-Pre: 60 %
FVC-Post: 3.18 L
FVC-Pre: 2.59 L
Post FEV1/FVC ratio: 55 %
Post FEV6/FVC ratio: 97 %
Pre FEV1/FVC ratio: 55 %
Pre FEV6/FVC Ratio: 100 %
RV % pred: 361 %
RV: 8.09 L
TLC % pred: 163 %
TLC: 10.8 L

## 2020-11-05 MED ORDER — TRELEGY ELLIPTA 100-62.5-25 MCG/INH IN AEPB: 1.0000 | INHALATION_SPRAY | Freq: Every day | RESPIRATORY_TRACT | 5 refills | Status: DC

## 2020-11-05 MED ORDER — CITALOPRAM HYDROBROMIDE 20 MG PO TABS: 20.0000 mg | ORAL_TABLET | Freq: Every day | ORAL | 5 refills | Status: DC

## 2020-11-05 MED ORDER — VENTOLIN HFA 108 (90 BASE) MCG/ACT IN AERS: 2.0000 | INHALATION_SPRAY | Freq: Four times a day (QID) | RESPIRATORY_TRACT | 5 refills | Status: DC | PRN

## 2020-11-05 NOTE — Patient Instructions (Addendum)
The patient should have follow up scheduled with myself in 3 months.   Start taking trelegy inhaler 1 puff once a day. - gargle after use.   Keep taking albuterol as needed in between.  Keep up the good work with cutting back on smoking!  Start taking citalopram 20 mg once a day in the evening for your nerves/anxiety. Side effects - dry mouth, nausea, sleepiness. It will take 6-8 weeks to start working.

## 2020-11-05 NOTE — Progress Notes (Signed)
Full PFT performed today. °

## 2020-11-05 NOTE — Progress Notes (Signed)
Christopher Conley    [id number]    15-Jul-1956  Primary Care Physician:Hasanaj, Samul Dada, MD Date of Appointment: 11/05/2020 Established Patient Visit  Chief complaint:   Chief Complaint  Patient presents with  . Follow-up    F/u after pft     HPI: Christopher Conley is a 65 y.o. gentleman with ongoing tobacco use disorder and COPD.  Interval Updates: Here for follow up after PFTs. Started on wellbutrin for smoking cessation but had to stop due to vivid dreams/nightmares.  Trying to cut down on smoking- down to 0.75 ppd from 2 ppd. Also had repeat CT Chest which we are reviewing today. Starts smoking to help calm his nerves. Worse around holidays, gets upset. He denies depression.  Gets worried about money, his health.Talks to his daughter and granddaughter but doesn't want to burden them either.  I have reviewed the patient's family social and past medical history and updated as appropriate.   Past Medical History:  Diagnosis Date  . Arthritis   . Bipolar disorder ([place])   . COPD (chronic obstructive pulmonary disease) ([place])   . Coronary artery disease   . Hypertension   . Myocardial infarction [facility])     Past Surgical History:  Procedure Laterality Date  . ANTERIOR LAT LUMBAR FUSION  2008  . APPENDECTOMY    . CARDIAC CATHETERIZATION    . COLONOSCOPY WITH PROPOFOL N/A 04/18/2016   Procedure: COLONOSCOPY WITH PROPOFOL;  Surgeon: Rogene Houston, MD;  [facility];  Service: Endoscopy;  Laterality: N/A;  830  . CORONARY ANGIOPLASTY    . HERNIA REPAIR     Umbilical  . POLYPECTOMY  04/18/2016   Procedure: POLYPECTOMY;  Surgeon: Rogene Houston, MD;  [facility];  Service: Endoscopy;;  hepatic flexure, splenic flexuere,and sigmoid polypectomies    Family History  Problem Relation Age of Onset  . CAD Brother   . COPD Brother     Social History   Occupational History  . Not on file  Tobacco Use  . Smoking status: Current Every Day Smoker     Packs/day: 2.00    Years: 49.00    Pack years: 98.00    Types: Cigarettes    Start date: 22  . Smokeless tobacco: Never Used  . Tobacco comment: smoking 3/4 ppd as of 11/05/20  Substance and Sexual Activity  . Alcohol use: No  . Drug use: No  . Sexual activity: Not on file     Physical Exam: Blood pressure 130/80, pulse 77, temperature 98.6 F (37 C), temperature source Temporal, height 5\' 8"  (1.727 m), weight 222 lb (100.7 kg), SpO2 94 %.  Gen:      No acute distress Lungs:    Diminished, No increased respiratory effort, symmetric chest wall excursion, no wheezes or crackles CV:         Regular rate and rhythm; no murmurs, rubs, or gallops.  No pedal edema   Data Reviewed: Imaging: I have personally reviewed the CT Chest from Feb 2022 which shows resolving nodule in the RML consistent with infectious etiology.   PFTs:  PFT Results Latest Ref Rng & Units 11/05/2020  FVC-Pre L 2.59  FVC-Predicted Pre % 60  FVC-Post L 3.18  FVC-Predicted Post % 74  Pre FEV1/FVC % % 55  Post FEV1/FCV % % 55  FEV1-Pre L 1.42  FEV1-Predicted Pre % 44  FEV1-Post L 1.76  DLCO uncorrected ml/min/mmHg 18.38  DLCO UNC% %  73  DLCO corrected ml/min/mmHg 18.38  DLCO COR %Predicted % 73  DLVA Predicted % 100  TLC L 10.80  TLC % Predicted % 163  RV % Predicted % 361   I have personally reviewed the patient's PFTs and they show severe airflow limitation with a positive BD response. Lung volumes show hyperinflation and air trapping. Diffusion capacity is mildly reduced.   Labs:  Immunization status: Immunization History  Administered Date(s) Administered  . Influenza, High Dose Seasonal PF 07/31/2020  . PFIZER(Purple Top)SARS-COV-2 Vaccination 05/04/2020, 05/31/2020  . Td,absorbed, Preservative Free, Adult Use, Lf Unspecified 02/09/1998, 08/21/2005    Assessment:  COPD - severe FEV1 44% Pulmonary Nodules - RML nodule improved/resolved Tobacco use disorder - cutting  back  Plan/Recommendations:  Stop wellbutrin. Will trial celexa. Continue smoking cessation attempts.  Continue albuterol inhaler. Will start maintenance inhaler with trelegy once daily. Recommend annual LDCT for lung cancer screening. Next due in Feb 2023.    Return to Care: Return in about 3 months (around 02/02/2021).   Lenice Llamas, MD Pulmonary and [place]

## 2020-12-05 DIAGNOSIS — J449 Chronic obstructive pulmonary disease, unspecified: Secondary | ICD-10-CM | POA: Diagnosis not present

## 2020-12-05 DIAGNOSIS — Z6832 Body mass index (BMI) 32.0-32.9, adult: Secondary | ICD-10-CM | POA: Diagnosis not present

## 2020-12-05 DIAGNOSIS — I10 Essential (primary) hypertension: Secondary | ICD-10-CM | POA: Diagnosis not present

## 2020-12-05 DIAGNOSIS — R69 Illness, unspecified: Secondary | ICD-10-CM | POA: Diagnosis not present

## 2020-12-10 ENCOUNTER — Other Ambulatory Visit: Payer: Self-pay | Admitting: Internal Medicine

## 2020-12-10 ENCOUNTER — Other Ambulatory Visit (HOSPITAL_COMMUNITY): Payer: Self-pay | Admitting: Internal Medicine

## 2020-12-10 DIAGNOSIS — R918 Other nonspecific abnormal finding of lung field: Secondary | ICD-10-CM

## 2020-12-25 DIAGNOSIS — I739 Peripheral vascular disease, unspecified: Secondary | ICD-10-CM | POA: Diagnosis not present

## 2020-12-25 DIAGNOSIS — J449 Chronic obstructive pulmonary disease, unspecified: Secondary | ICD-10-CM | POA: Diagnosis not present

## 2020-12-25 DIAGNOSIS — I10 Essential (primary) hypertension: Secondary | ICD-10-CM | POA: Diagnosis not present

## 2020-12-25 DIAGNOSIS — I2510 Atherosclerotic heart disease of native coronary artery without angina pectoris: Secondary | ICD-10-CM | POA: Diagnosis not present

## 2020-12-25 DIAGNOSIS — Z008 Encounter for other general examination: Secondary | ICD-10-CM | POA: Diagnosis not present

## 2020-12-25 DIAGNOSIS — E785 Hyperlipidemia, unspecified: Secondary | ICD-10-CM | POA: Diagnosis not present

## 2020-12-25 DIAGNOSIS — R69 Illness, unspecified: Secondary | ICD-10-CM | POA: Diagnosis not present

## 2020-12-25 DIAGNOSIS — G8929 Other chronic pain: Secondary | ICD-10-CM | POA: Diagnosis not present

## 2020-12-25 DIAGNOSIS — E669 Obesity, unspecified: Secondary | ICD-10-CM | POA: Diagnosis not present

## 2020-12-27 ENCOUNTER — Ambulatory Visit (HOSPITAL_COMMUNITY)
Admission: RE | Admit: 2020-12-27 | Discharge: 2020-12-27 | Disposition: A | Discharge status: Home / Self Care | Payer: Medicare HMO | Source: Ambulatory Visit | Attending: Internal Medicine | Admitting: Internal Medicine

## 2020-12-27 ENCOUNTER — Other Ambulatory Visit: Payer: Self-pay

## 2020-12-27 DIAGNOSIS — I700 Atherosclerosis of aorta: Secondary | ICD-10-CM | POA: Diagnosis not present

## 2020-12-27 DIAGNOSIS — R911 Solitary pulmonary nodule: Secondary | ICD-10-CM | POA: Diagnosis not present

## 2020-12-27 DIAGNOSIS — R918 Other nonspecific abnormal finding of lung field: Secondary | ICD-10-CM | POA: Diagnosis not present

## 2020-12-27 LAB — GLUCOSE, CAPILLARY: Glucose-Capillary: 89 mg/dL (ref 70–99)

## 2020-12-27 MED ORDER — FLUDEOXYGLUCOSE F - 18 (FDG) INJECTION
11.1000 | Freq: Once | INTRAVENOUS | Status: AC
  Administered 2020-12-27: 11.45 via INTRAVENOUS

## 2021-01-01 ENCOUNTER — Encounter: Payer: Self-pay | Admitting: *Deleted

## 2021-01-16 ENCOUNTER — Other Ambulatory Visit: Payer: Self-pay

## 2021-01-16 ENCOUNTER — Ambulatory Visit: Payer: Medicare HMO | Admitting: Internal Medicine

## 2021-01-16 ENCOUNTER — Encounter: Payer: Self-pay | Admitting: Internal Medicine

## 2021-01-16 VITALS — BP 148/78 | HR 72 | Ht 68.0 in | Wt 220.0 lb

## 2021-01-16 DIAGNOSIS — F3289 Other specified depressive episodes: Secondary | ICD-10-CM

## 2021-01-16 DIAGNOSIS — J441 Chronic obstructive pulmonary disease with (acute) exacerbation: Secondary | ICD-10-CM | POA: Diagnosis not present

## 2021-01-16 DIAGNOSIS — F17210 Nicotine dependence, cigarettes, uncomplicated: Secondary | ICD-10-CM

## 2021-01-16 DIAGNOSIS — F17200 Nicotine dependence, unspecified, uncomplicated: Secondary | ICD-10-CM

## 2021-01-16 DIAGNOSIS — R69 Illness, unspecified: Secondary | ICD-10-CM | POA: Diagnosis not present

## 2021-01-16 DIAGNOSIS — F419 Anxiety disorder, unspecified: Secondary | ICD-10-CM | POA: Diagnosis not present

## 2021-01-16 MED ORDER — TRELEGY ELLIPTA 100-62.5-25 MCG/INH IN AEPB: 1.0000 | INHALATION_SPRAY | Freq: Every day | RESPIRATORY_TRACT | 5 refills | Status: DC

## 2021-01-16 MED ORDER — VENTOLIN HFA 108 (90 BASE) MCG/ACT IN AERS: 2.0000 | INHALATION_SPRAY | Freq: Four times a day (QID) | RESPIRATORY_TRACT | 5 refills | Status: AC | PRN

## 2021-01-16 MED ORDER — CITALOPRAM HYDROBROMIDE 20 MG PO TABS: 20.0000 mg | ORAL_TABLET | Freq: Every day | ORAL | 5 refills | Status: DC

## 2021-01-16 NOTE — Patient Instructions (Signed)
The patient should have follow up scheduled with myself in 4 months.   Keep taking the celexa for your mood. If your breathing does not feel better in the next week, please call me.  Keep taking trelegy and albuterol the rescue inhalers.    What are the benefits of quitting smoking? Quitting smoking can lower your chances of getting or dying from heart disease, lung disease, kidney failure, infection, or cancer. It can also lower your chances of getting osteoporosis, a condition that makes your bones weak. Plus, quitting smoking can help your skin look younger and reduce the chances that you will have problems with sex.  Quitting smoking will improve your health no matter how old you are, and no matter how long or how much you have smoked.  What should I do if I want to quit smoking? The letters in the word "START" can help you remember the steps to take: S = Set a quit date. T = Tell family, friends, and the people around you that you plan to quit. A = Anticipate or plan ahead for the tough times you'll face while quitting. R = Remove cigarettes and other tobacco products from your home, car, and work. T = Talk to your doctor about getting help to quit.  How can my doctor or nurse help? Your doctor or nurse can give you advice on the best way to quit. He or she can also put you in touch with counselors or other people you can call for support. Plus, your doctor or nurse can give you medicines to: ?Reduce your craving for cigarettes ?Reduce the unpleasant symptoms that happen when you stop smoking (called "withdrawal symptoms"). You can also get help from a free phone line (1-800-QUIT-NOW) or go online to [employer].  What are the symptoms of withdrawal? The symptoms include: ?Trouble sleeping ?Being irritable, anxious or restless ?Getting frustrated or angry ?Having trouble thinking clearly  Some people who stop smoking become temporarily depressed. Some people need treatment  for depression, such as counseling or antidepressant medicines. Depressed people might: ?No longer enjoy or care about doing the things they used to like to do ?Feel sad, down, hopeless, nervous, or cranky most of the day, almost every day ?Lose or gain weight ?Sleep too much or too little ?Feel tired or like they have no energy ?Feel guilty or like they are worth nothing ?Forget things or feel confused ?Move and speak more slowly than usual ?Act restless or have trouble staying still ?Think about death or suicide  If you think you might be depressed, see your doctor or nurse. Only someone trained in mental health can tell for sure if you are depressed. If you ever feel like you might hurt yourself, go straight to the nearest emergency department. Or you can call for an ambulance (in the [place] and [place], [place] 9-1-1) or call your doctor or nurse right away and tell them it is an emergency. You can also reach the [place] National Suicide Prevention Lifeline at 3106988611 or http://walker-sanchez.info/.  How do medicines help you stop smoking? Different medicines work in different ways: ?Nicotine replacement therapy eases withdrawal and reduces your body's craving for nicotine, the main drug found in cigarettes. There are different forms of nicotine replacement, including skin patches, lozenges, gum, nasal sprays, and "puffers" or inhalers. Many can be bought without a prescription, while others might require one. ?Bupropion is a prescription medicine that reduces your desire to smoke. This medicine is sold under the brand names  Zyban and Wellbutrin. It is also available in a generic version, which is cheaper than brand name medicines. ?Varenicline (brand names: Chantix, Champix) is a prescription medicine that reduces withdrawal symptoms and cigarette cravings. If you think you'd like to take varenicline and you have a history of depression, anxiety, or heart disease, discuss this with your  doctor or nurse before taking the medicine. Varenicline can also increase the effects of alcohol in some people. It's a good idea to limit drinking while you're taking it, at least until you know how it affects you.  How does counseling work? Counseling can happen during formal office visits or just over the phone. A counselor can help you: ?Figure out what triggers your smoking and what to do instead ?Overcome cravings ?Figure out what went wrong when you tried to quit before  What works best? Studies show that people have the best luck at quitting if they take medicines to help them quit and work with a Social worker. It might also be helpful to combine nicotine replacement with one of the prescription medicines that help people quit. In some cases, it might even make sense to take bupropion and varenicline together.  What about e-cigarettes? Sometimes people wonder if using electronic cigarettes, or "e-cigarettes," might help them quit smoking. Using e-cigarettes is also called "vaping." Doctors do not recommend e-cigarettes in place of medicines and counseling. That's because e-cigarettes still contain nicotine as well as other substances that might be harmful. It's not clear how they can affect a person's health in the long term.  Will I gain weight if I quit? Yes, you might gain a few pounds. But quitting smoking will have a much more positive effect on your health than weighing a few pounds more. Plus, you can help prevent some weight gain by being more active and eating less. Taking the medicine bupropion might help control weight gain.   What else can I do to improve my chances of quitting? You can: ?Start exercising. ?Stay away from smokers and places that you associate with smoking. If people close to you smoke, ask them to quit with you. ?Keep gum, hard candy, or something to put in your mouth handy. If you get a craving for a cigarette, try one of these instead. ?Don't give up, even if  you start smoking again. It takes most people a few tries before they succeed.  What if I am pregnant and I smoke? If you are pregnant, it's really important for the health of your baby that you quit. Ask your doctor what options you have, and what is safest for your baby

## 2021-01-16 NOTE — Progress Notes (Signed)
Christopher Conley    [id number]    [birth year]  Primary Care Physician:Hasanaj, Samul Dada, MD Date of Appointment: 01/16/2021 Established Patient Visit  Chief complaint:   Chief Complaint  Patient presents with  . COPD    3 month follow up, no issues at this time     HPI: Christopher Conley is a 65 y.o. gentleman with ongoing tobacco use disorder and severe COPD.   Interval Updates: Here for follow up after starting trelegy.  Feels better with breathing standpoint. He says it works "pretty good." Using albuterol rescue inhaler once every two days. Today is a bad day and he has already used it twice.   He is smoking about a pack a day. Helps calm his nerves.  He is wondering if he needs abx today because today is a bad day.  His PCP obtained a PET scan recently which I have reviewed.   Depression is controlled, no SE to celexa. No SI/HI.  I have reviewed the patient's family social and past medical history and updated as appropriate.   Past Medical History:  Diagnosis Date  . Arthritis   . Bipolar disorder ([place])   . COPD (chronic obstructive pulmonary disease) ([place])   . Coronary artery disease   . Hypertension   . Myocardial infarction [facility])     Past Surgical History:  Procedure Laterality Date  . ANTERIOR LAT LUMBAR FUSION  2008  . APPENDECTOMY    . CARDIAC CATHETERIZATION    . COLONOSCOPY WITH PROPOFOL N/A 04/18/2016   Procedure: COLONOSCOPY WITH PROPOFOL;  Surgeon: Rogene Houston, MD;  [facility];  Service: Endoscopy;  Laterality: N/A;  830  . CORONARY ANGIOPLASTY    . HERNIA REPAIR     Umbilical  . POLYPECTOMY  04/18/2016   Procedure: POLYPECTOMY;  Surgeon: Rogene Houston, MD;  [facility];  Service: Endoscopy;;  hepatic flexure, splenic flexuere,and sigmoid polypectomies    Family History  Problem Relation Age of Onset  . CAD Brother   . COPD Brother     Social History   Occupational History  . Not on file  Tobacco Use  .  Smoking status: Current Every Day Smoker    Packs/day: 2.00    Years: 49.00    Pack years: 98.00    Types: Cigarettes    Start date: 73  . Smokeless tobacco: Never Used  . Tobacco comment: smoking 3/4 ppd as of 11/05/20  Substance and Sexual Activity  . Alcohol use: No  . Drug use: No  . Sexual activity: Not on file     Physical Exam: Blood pressure (!) 148/78, pulse 72, height 5\' 8"  (1.727 m), weight 220 lb (99.8 kg), SpO2 94 %.  Gen:      No acute distress Lungs:    Diminished, no increased work of breathing, but there is frequent coughing and bilateral wheezes and rhonchi that clear with coughing CV:         RRR, no mrg   Data Reviewed: Imaging: I have personally reviewed the CT Chest from Feb 2022 which shows resolving nodule in the RML consistent with infectious etiology.   Pet scan reviewed December 28 2020 - no pulmonary nodules   PFTs:  PFT Results Latest Ref Rng & Units 11/05/2020  FVC-Pre L 2.59  FVC-Predicted Pre % 60  FVC-Post L 3.18  FVC-Predicted Post % 74  Pre FEV1/FVC % % 55  Post FEV1/FCV % %  55  FEV1-Pre L 1.42  FEV1-Predicted Pre % 44  FEV1-Post L 1.76  DLCO uncorrected ml/min/mmHg 18.38  DLCO UNC% % 73  DLCO corrected ml/min/mmHg 18.38  DLCO COR %Predicted % 73  DLVA Predicted % 100  TLC L 10.80  TLC % Predicted % 163  RV % Predicted % 361   I have personally reviewed the patient's PFTs and they show severe airflow limitation with a positive BD response. Lung volumes show hyperinflation and air trapping. Diffusion capacity is mildly reduced.   Labs:  Immunization status: Immunization History  Administered Date(s) Administered  . Influenza, High Dose Seasonal PF 07/31/2020  . PFIZER(Purple Top)SARS-COV-2 Vaccination 05/04/2020, 05/31/2020  . Td,absorbed, Preservative Free, Adult Use, Lf Unspecified 02/09/1998, 08/21/2005    Assessment:  COPD with acute exacerbation - severe FEV1 44% Tobacco use  disorder Depression  Plan/Recommendations: He is having an exacerbation of his COPD today. We discussed a short course of prednisone to help his breathing. He would rather not after hearing the side effects. If not improving in the next week recommend he call for a course of prednisone.  Continue albuterol inhaler. Continue inhaler with trelegy once daily for maintenance. Recommend annual LDCT for lung cancer screening. Next due in April 2023.  Depression controlled, continue celexa  Smoking Cessation Counseling:  1. The patient is an everyday smoker and symptomatic due to the following condition COPD 2. The patient is currently pre-contemplative in quitting smoking. 3. I advised patient to quit smoking. 4. We identified patient specific barriers to change.  5. I personally spent 3 minutes counseling the patient regarding tobacco use disorder. 6. We discussed management of stress and anxiety to help with smoking cessation, when applicable. 7. We discussed nicotine replacement therapy, Wellbutrin, Chantix as possible options. 8. I advised setting a quit date. 9. Follow?up arranged with our office to continue ongoing discussions. 10.Resources given to patient including quit hotline.    Return to Care: Return in about 4 months (around 05/19/2021).   Lenice Llamas, MD Pulmonary and [place]

## 2021-03-13 DIAGNOSIS — Z6832 Body mass index (BMI) 32.0-32.9, adult: Secondary | ICD-10-CM | POA: Diagnosis not present

## 2021-03-13 DIAGNOSIS — J449 Chronic obstructive pulmonary disease, unspecified: Secondary | ICD-10-CM | POA: Diagnosis not present

## 2021-03-13 DIAGNOSIS — R69 Illness, unspecified: Secondary | ICD-10-CM | POA: Diagnosis not present

## 2021-03-13 DIAGNOSIS — I10 Essential (primary) hypertension: Secondary | ICD-10-CM | POA: Diagnosis not present

## 2021-04-09 ENCOUNTER — Encounter (INDEPENDENT_AMBULATORY_CARE_PROVIDER_SITE_OTHER): Payer: Self-pay | Admitting: *Deleted

## 2021-06-13 DIAGNOSIS — M79661 Pain in right lower leg: Secondary | ICD-10-CM | POA: Diagnosis not present

## 2021-06-13 DIAGNOSIS — M25561 Pain in right knee: Secondary | ICD-10-CM | POA: Diagnosis not present

## 2021-06-13 DIAGNOSIS — S8001XA Contusion of right knee, initial encounter: Secondary | ICD-10-CM | POA: Diagnosis not present

## 2021-06-13 DIAGNOSIS — Z7951 Long term (current) use of inhaled steroids: Secondary | ICD-10-CM | POA: Diagnosis not present

## 2021-06-13 DIAGNOSIS — I2510 Atherosclerotic heart disease of native coronary artery without angina pectoris: Secondary | ICD-10-CM | POA: Diagnosis not present

## 2021-06-13 DIAGNOSIS — I509 Heart failure, unspecified: Secondary | ICD-10-CM | POA: Diagnosis not present

## 2021-06-13 DIAGNOSIS — M7989 Other specified soft tissue disorders: Secondary | ICD-10-CM | POA: Diagnosis not present

## 2021-06-13 DIAGNOSIS — W11XXXA Fall on and from ladder, initial encounter: Secondary | ICD-10-CM | POA: Diagnosis not present

## 2021-06-13 DIAGNOSIS — J449 Chronic obstructive pulmonary disease, unspecified: Secondary | ICD-10-CM | POA: Diagnosis not present

## 2021-06-13 DIAGNOSIS — R69 Illness, unspecified: Secondary | ICD-10-CM | POA: Diagnosis not present

## 2021-06-13 DIAGNOSIS — Z79899 Other long term (current) drug therapy: Secondary | ICD-10-CM | POA: Diagnosis not present

## 2021-06-18 DIAGNOSIS — Z6833 Body mass index (BMI) 33.0-33.9, adult: Secondary | ICD-10-CM | POA: Diagnosis not present

## 2021-06-18 DIAGNOSIS — J449 Chronic obstructive pulmonary disease, unspecified: Secondary | ICD-10-CM | POA: Diagnosis not present

## 2021-06-18 DIAGNOSIS — I700 Atherosclerosis of aorta: Secondary | ICD-10-CM | POA: Diagnosis not present

## 2021-06-18 DIAGNOSIS — S8781XA Crushing injury of right lower leg, initial encounter: Secondary | ICD-10-CM | POA: Diagnosis not present

## 2021-06-18 DIAGNOSIS — I10 Essential (primary) hypertension: Secondary | ICD-10-CM | POA: Diagnosis not present

## 2021-06-18 DIAGNOSIS — R69 Illness, unspecified: Secondary | ICD-10-CM | POA: Diagnosis not present

## 2021-06-18 DIAGNOSIS — M7981 Nontraumatic hematoma of soft tissue: Secondary | ICD-10-CM | POA: Diagnosis not present

## 2021-07-16 ENCOUNTER — Other Ambulatory Visit: Payer: Self-pay | Admitting: *Deleted

## 2021-07-16 MED ORDER — TRELEGY ELLIPTA 100-62.5-25 MCG/ACT IN AEPB: 1.0000 | INHALATION_SPRAY | Freq: Every day | RESPIRATORY_TRACT | 5 refills | Status: DC

## 2021-10-07 DIAGNOSIS — R69 Illness, unspecified: Secondary | ICD-10-CM | POA: Diagnosis not present

## 2021-10-07 DIAGNOSIS — J449 Chronic obstructive pulmonary disease, unspecified: Secondary | ICD-10-CM | POA: Diagnosis not present

## 2021-10-07 DIAGNOSIS — Z0000 Encounter for general adult medical examination without abnormal findings: Secondary | ICD-10-CM | POA: Diagnosis not present

## 2021-10-07 DIAGNOSIS — I10 Essential (primary) hypertension: Secondary | ICD-10-CM | POA: Diagnosis not present

## 2021-10-07 DIAGNOSIS — Z6833 Body mass index (BMI) 33.0-33.9, adult: Secondary | ICD-10-CM | POA: Diagnosis not present

## 2021-10-07 DIAGNOSIS — I700 Atherosclerosis of aorta: Secondary | ICD-10-CM | POA: Diagnosis not present

## 2022-01-07 DIAGNOSIS — F418 Other specified anxiety disorders: Secondary | ICD-10-CM | POA: Diagnosis not present

## 2022-01-07 DIAGNOSIS — Z0000 Encounter for general adult medical examination without abnormal findings: Secondary | ICD-10-CM | POA: Diagnosis not present

## 2022-01-07 DIAGNOSIS — Z6832 Body mass index (BMI) 32.0-32.9, adult: Secondary | ICD-10-CM | POA: Diagnosis not present

## 2022-01-07 DIAGNOSIS — Z125 Encounter for screening for malignant neoplasm of prostate: Secondary | ICD-10-CM | POA: Diagnosis not present

## 2022-01-07 DIAGNOSIS — J449 Chronic obstructive pulmonary disease, unspecified: Secondary | ICD-10-CM | POA: Diagnosis not present

## 2022-01-07 DIAGNOSIS — I700 Atherosclerosis of aorta: Secondary | ICD-10-CM | POA: Diagnosis not present

## 2022-01-07 DIAGNOSIS — R69 Illness, unspecified: Secondary | ICD-10-CM | POA: Diagnosis not present

## 2022-01-07 DIAGNOSIS — I10 Essential (primary) hypertension: Secondary | ICD-10-CM | POA: Diagnosis not present

## 2022-01-23 ENCOUNTER — Encounter (INDEPENDENT_AMBULATORY_CARE_PROVIDER_SITE_OTHER): Payer: Self-pay | Admitting: *Deleted

## 2022-02-19 ENCOUNTER — Encounter (INDEPENDENT_AMBULATORY_CARE_PROVIDER_SITE_OTHER): Payer: Self-pay | Admitting: Gastroenterology

## 2022-03-04 DIAGNOSIS — Z6833 Body mass index (BMI) 33.0-33.9, adult: Secondary | ICD-10-CM | POA: Diagnosis not present

## 2022-03-04 DIAGNOSIS — M6281 Muscle weakness (generalized): Secondary | ICD-10-CM | POA: Diagnosis not present

## 2022-03-04 DIAGNOSIS — M545 Low back pain, unspecified: Secondary | ICD-10-CM | POA: Diagnosis not present

## 2022-03-04 DIAGNOSIS — G8929 Other chronic pain: Secondary | ICD-10-CM | POA: Diagnosis not present

## 2022-03-04 DIAGNOSIS — R292 Abnormal reflex: Secondary | ICD-10-CM | POA: Diagnosis not present

## 2022-03-19 IMAGING — CT CT CHEST W/O CM
2 of 4 series · 15 of 36 positions shown, 18 images · non-contrast
Comparison: 06/28/2020

CLINICAL DATA: Follow-up lung nodules, shortness of breath and
dyspnea for 1 year, smoker, COPD

EXAM:
CT CHEST WITHOUT CONTRAST
TECHNIQUE: Multidetector CT imaging of the chest was performed following the
standard protocol without IV contrast.

[Series 2: routine chest without · axial · non-contrast · 0.84mm/px · z∈[+1124,+1400]mm · 12 of 164 slices shown, 15 images]
[im 13/164  mediastinal]
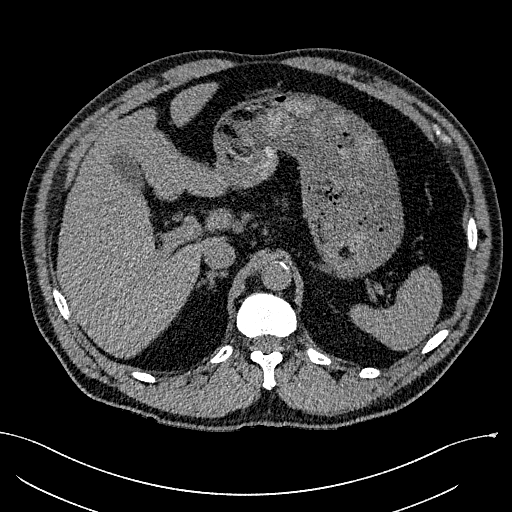
[im 13/164  lung]
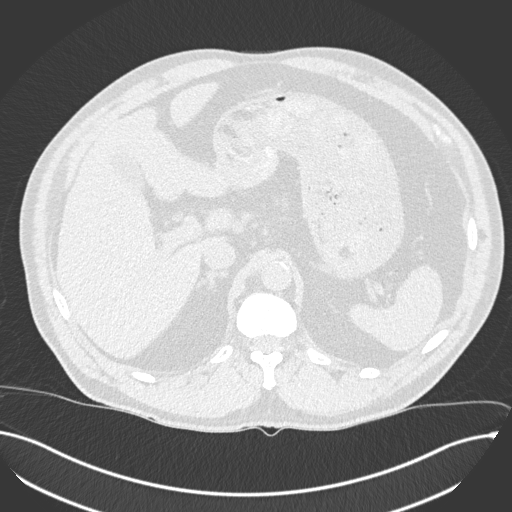
[im 26/164  lung]
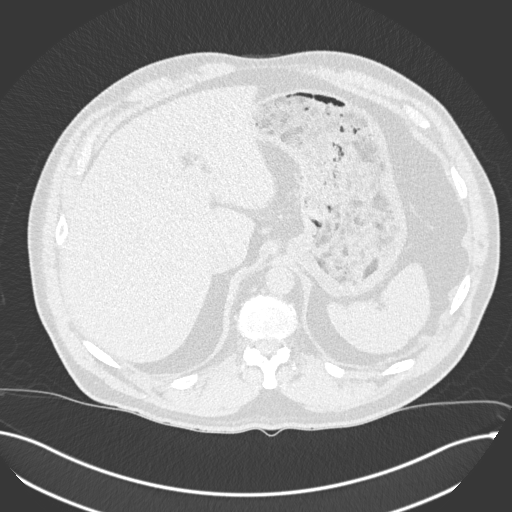
[im 38/164  lung]
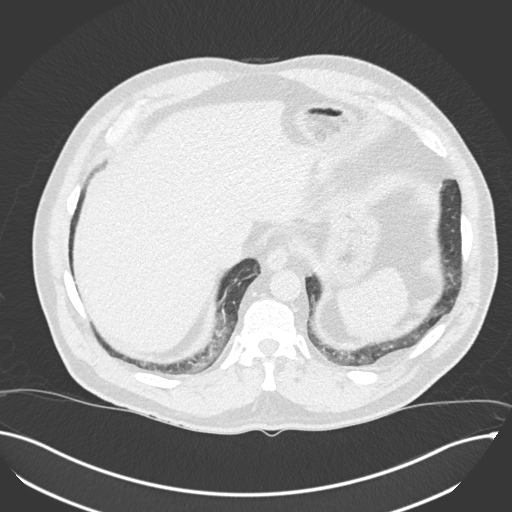
[im 51/164  lung]
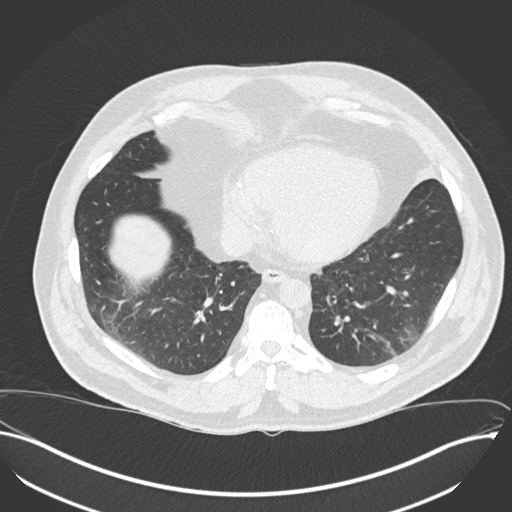
[im 63/164  mediastinal]
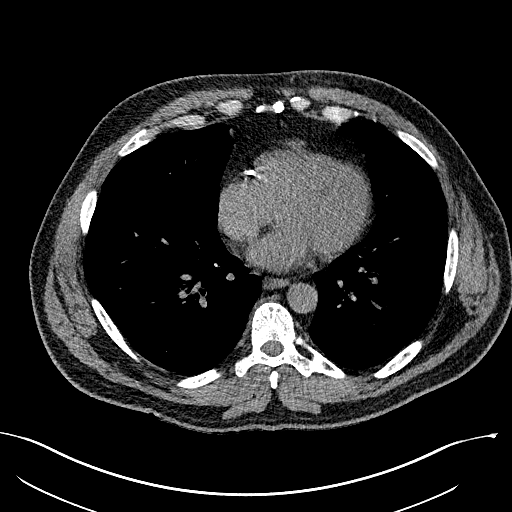
[im 63/164  lung]
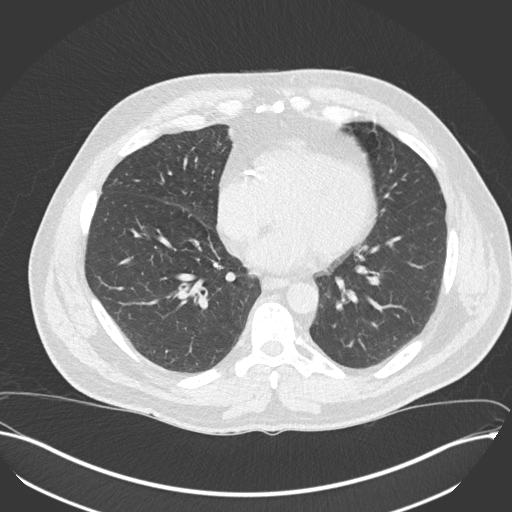
[im 76/164  lung]
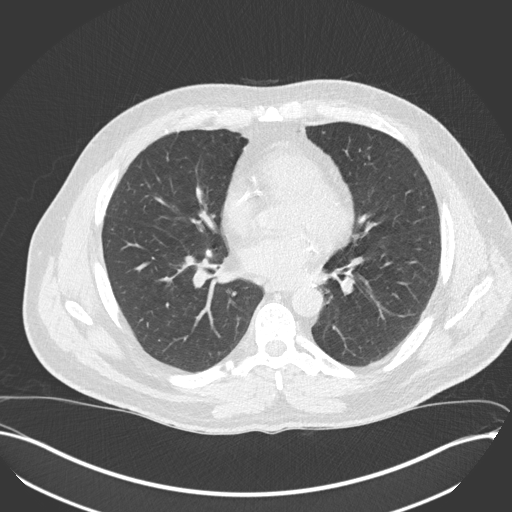
[im 88/164  lung]
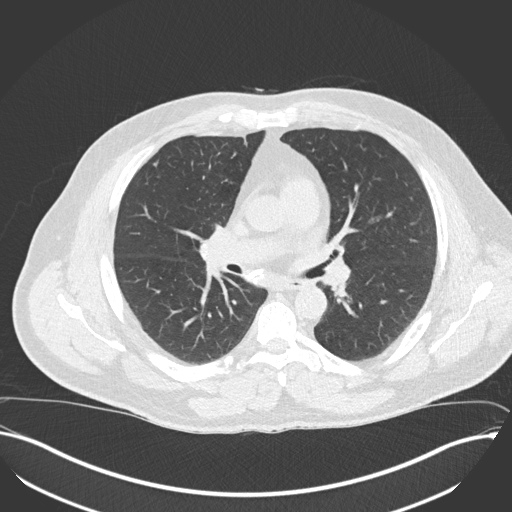
[im 101/164  lung]
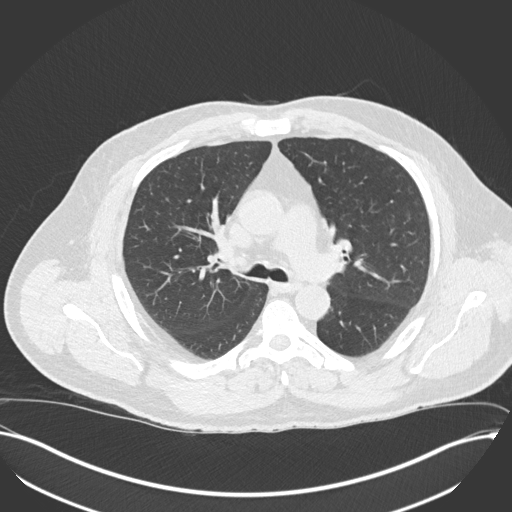
[im 113/164  mediastinal]
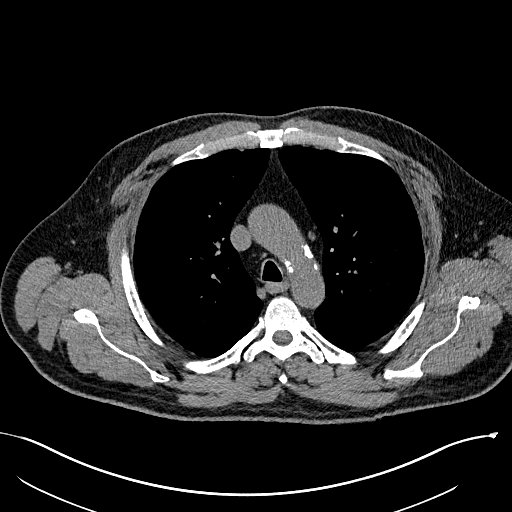
[im 113/164  lung]
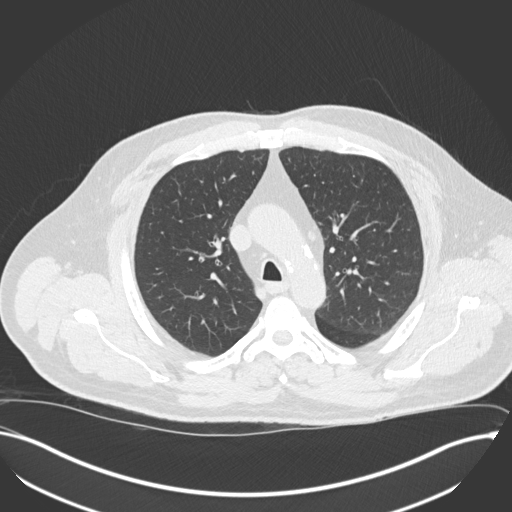
[im 126/164  lung]
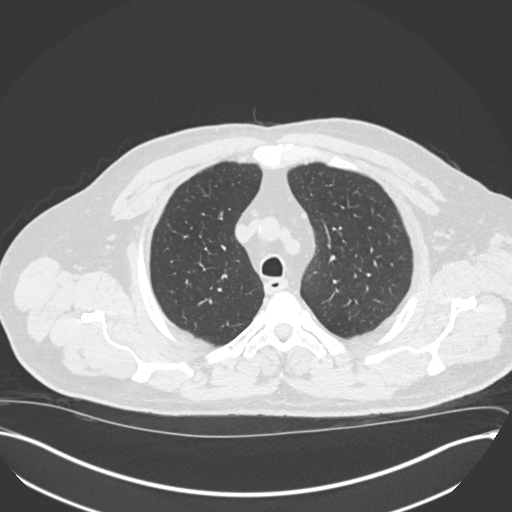
[im 138/164  lung]
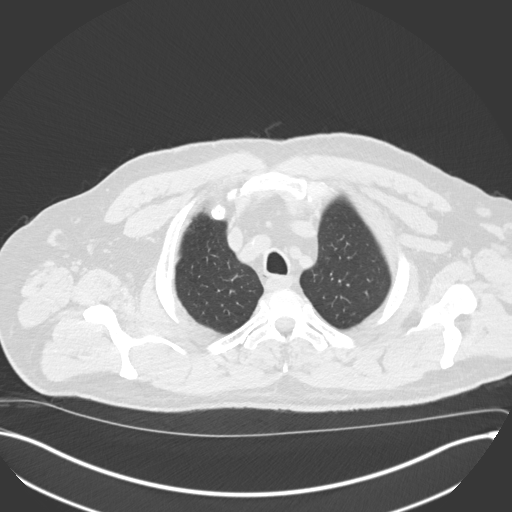
[im 151/164  lung]
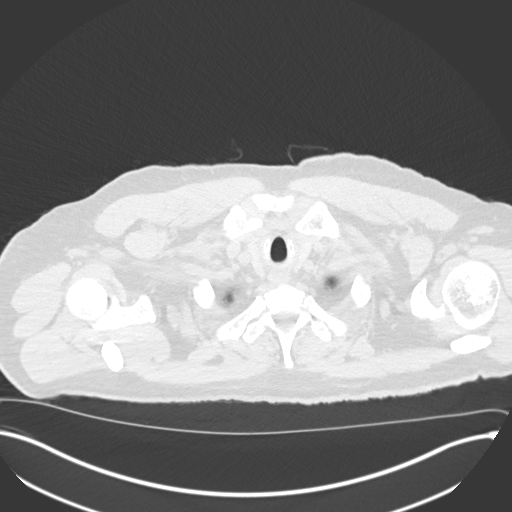

[Series 5: coronal · coronal · 0.70mm/px · 3 of 177 slices shown]
[im 36/177  lung]
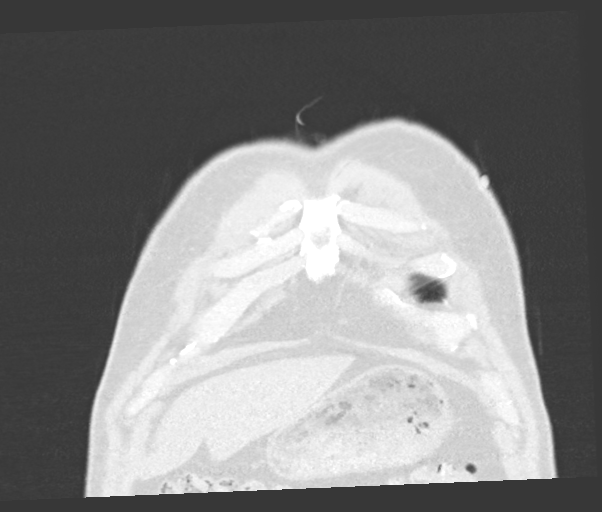
[im 71/177  lung]
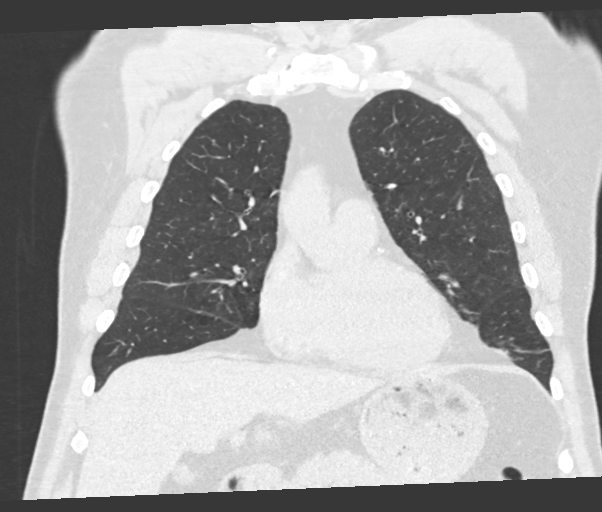
[im 106/177  lung]
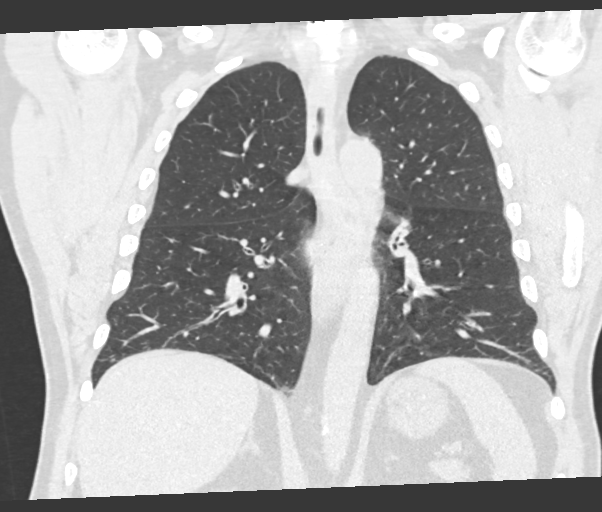

[15 of 36 positions shown; findings below may reference images not displayed]

FINDINGS: Cardiovascular: Aortic atherosclerosis. Normal heart size.
Three-vessel coronary artery calcifications and/or stents. No
pericardial effusion.

Mediastinum/Nodes: Calcified mediastinal and right hilar lymph
nodes. Thyroid gland, trachea, and esophagus demonstrate no
significant findings.

Lungs/Pleura: Diffuse bilateral bronchial wall thickening. Minimal
dependent bibasilar scarring and or atelectasis. Calcified,
definitively benign nodules of the anterior right lower lobe (series
4, image 84). Interval resolution of previously seen consolidation
of the medial segment right middle lobe (series 4, image 105).
Bandlike atelectasis of the lingula (series 4, image 108).
Background of very fine centrilobular nodularity, most concentrated
in the lung apices. No pleural effusion or pneumothorax.

Upper Abdomen: No acute abnormality.

Musculoskeletal: No chest wall mass or suspicious bone lesions
identified.
IMPRESSION: 1. Interval resolution of previously seen consolidation of the
medial segment right middle lobe, consistent with resolution of
benign infection or inflammation.
2. Background of very fine centrilobular nodularity, most
concentrated in the lung apices, consistent with smoking-related
respiratory bronchiolitis.
3. Diffuse bilateral bronchial wall thickening, consistent with
nonspecific infectious or inflammatory bronchitis.
4. Coronary artery disease.

Aortic Atherosclerosis ([id number].J).

## 2022-03-27 ENCOUNTER — Ambulatory Visit (INDEPENDENT_AMBULATORY_CARE_PROVIDER_SITE_OTHER): Payer: Medicare HMO | Admitting: Gastroenterology

## 2022-04-07 ENCOUNTER — Telehealth (INDEPENDENT_AMBULATORY_CARE_PROVIDER_SITE_OTHER): Payer: Self-pay

## 2022-04-07 ENCOUNTER — Encounter (INDEPENDENT_AMBULATORY_CARE_PROVIDER_SITE_OTHER): Payer: Self-pay

## 2022-04-07 ENCOUNTER — Ambulatory Visit (INDEPENDENT_AMBULATORY_CARE_PROVIDER_SITE_OTHER): Payer: Medicare HMO | Admitting: Gastroenterology

## 2022-04-07 ENCOUNTER — Encounter (INDEPENDENT_AMBULATORY_CARE_PROVIDER_SITE_OTHER): Payer: Self-pay | Admitting: Gastroenterology

## 2022-04-07 ENCOUNTER — Other Ambulatory Visit (INDEPENDENT_AMBULATORY_CARE_PROVIDER_SITE_OTHER): Payer: Self-pay

## 2022-04-07 VITALS — BP 156/77 | HR 58 | Temp 98.0°F | Ht 68.0 in | Wt 219.0 lb

## 2022-04-07 DIAGNOSIS — R197 Diarrhea, unspecified: Secondary | ICD-10-CM | POA: Diagnosis not present

## 2022-04-07 DIAGNOSIS — D126 Benign neoplasm of colon, unspecified: Secondary | ICD-10-CM

## 2022-04-07 DIAGNOSIS — K5900 Constipation, unspecified: Secondary | ICD-10-CM

## 2022-04-07 NOTE — Telephone Encounter (Signed)
Jaythan Hinely Ann Gradyn Shein, CMA  ?

## 2022-04-07 NOTE — Patient Instructions (Signed)
It was nice to meet you! We will get you scheduled for colonoscopy If you have recurrence of diarrhea, please let me know In regards to constipation, make sure you are drinking plenty of water, eating a diet high in fruits, veggies and whole grains, you can try adding over the counter benefiber (or generic of this) 1T three times daily with meals to help soften stools and avoid needing to strain   Congratulations on not smoking since Saturday, keep up the good work!!  Follow up 3 months

## 2022-04-07 NOTE — Progress Notes (Signed)
Referring Provider: Neale Burly, MD Primary Care Physician:  Neale Burly, MD Primary GI Physician: new  Chief Complaint  Patient presents with   Diarrhea    Patient here today with complaints of diarrhea. Symptoms off and on for the last three months. He is not currently taking any medications for diarrhea.   HPI:   Christopher Conley is a 66 y.o. male with past medical history of arthritis, bipolar, COPD, CAD, HTN, MI.   Patient presenting today as a new patient for diarrhea.   Patient reports about 3 months ago, he began having watery BMs, this occurred 2-3x per day, States last watery stool was about 3-4 weeks ago. Denies associated fevers or chills. Stools seem mostly back to normal at this time. Previously he notes more constipation with the need to strain to have a BM, he was going only 1-2x/week prior to diarrhea episodes, now going more regularly.  Denies abdominal pain, nausea, vomiting,rectal bleeding, melena, or weight loss. He denies any new medications or  antibiotics.   NSAID use: only tylenol  Social hx:no etoh, previously 2-3 PPD, last cig was on Saturday  Fam hx: no crc or liver disease  Last Colonoscopy:2017 Preparation of the colon was fair. - Three 4 to 7 mm polyps in the sigmoid colon, at the splenic flexure and in the transverse colon, removed with a cold snare. Resected and retrieved. Clip (MR conditional) was placed at splenic flexure polypectomy site. Last Endoscopy:  Recommendations:  Repeat colonoscopy in 5 years  Past Medical History:  Diagnosis Date   Arthritis    Bipolar disorder ([place])    COPD (chronic obstructive pulmonary disease) ([place])    Coronary artery disease    Hypertension    Myocardial infarction [facility])     Past Surgical History:  Procedure Laterality Date   ANTERIOR LAT LUMBAR FUSION  2008   APPENDECTOMY     CARDIAC CATHETERIZATION     COLONOSCOPY WITH PROPOFOL N/A 04/18/2016   Procedure: COLONOSCOPY WITH PROPOFOL;  Surgeon:  Rogene Houston, MD;  Location: [facility];  Service: Endoscopy;  Laterality: N/A;  [place]     Umbilical   POLYPECTOMY  04/18/2016   Procedure: POLYPECTOMY;  Surgeon: Rogene Houston, MD;  Location: [facility] END[facility] SU[facility];  Service: Endoscopy;;  hepatic flexure, splenic flexuere,and sigmoid polypectomies    Current Outpatient Medications  Medication Sig Dispense Refill   acetaminophen (TYLENOL) 500 MG tablet Take 1,000 mg by mouth every 6 (six) hours as needed for mild pain.     citalopram (CELEXA) 20 MG tablet Take 1 tablet (20 mg total) by mouth daily. 30 tablet 5   EFFIENT 10 MG TABS tablet Take 1 tablet (10 mg total) by mouth daily. 30 tablet 1   metoprolol (LOPRESSOR) 50 MG tablet Take 25 mg by mouth 2 (two) times daily.  0   simvastatin (ZOCOR) 40 MG tablet Take 40 mg by mouth daily.  0   tamsulosin (FLOMAX) 0.4 MG CAPS capsule Take 0.4 mg by mouth daily.  1   VENTOLIN HFA 108 (90 Base) MCG/ACT inhaler Inhale 2 puffs into the lungs every 6 (six) hours as needed for wheezing or shortness of breath. as directed 1 each 5   No current facility-administered medications for this visit.    Allergies as of 04/07/2022   (No Known Allergies)    Family History  Problem Relation Age of Onset   CAD Brother  COPD Brother     Social History   Socioeconomic History   Marital status: Divorced    Spouse name: Not on file   Number of children: Not on file   Years of education: Not on file   Highest education level: Not on file  Occupational History   Not on file  Tobacco Use   Smoking status: Former    Packs/day: 2.00    Years: 49.00    Total pack years: 98.00    Types: Cigarettes    Start date: 68    Quit date: 04/05/2022   Smokeless tobacco: Never   Tobacco comments:    smoking 3/4 ppd as of 11/05/20  Vaping Use   Vaping Use: Never used  Substance and Sexual Activity   Alcohol use: No   Drug use: No   Sexual activity: Not on file  Other  Topics Concern   Not on file  Social History Narrative   Not on file   Social Determinants of Health   Financial Resource Strain: Not on file  Food Insecurity: Not on file  Transportation Needs: Not on file  Physical Activity: Not on file  Stress: Not on file  Social Connections: Not on file   Review of systems General: negative for malaise, night sweats, fever, chills, weight loss Neck: Negative for lumps, goiter, pain and significant neck swelling Resp: Negative for cough, wheezing, dyspnea at rest CV: Negative for chest pain, leg swelling, palpitations, orthopnea GI: denies melena, hematochezia, nausea, vomiting, dysphagia, odyonophagia, early satiety or unintentional weight loss. +previous diarrhea +harder stools MSK: Negative for joint pain or swelling, back pain, and muscle pain. Derm: Negative for itching or rash Psych: Denies depression, anxiety, memory loss, confusion. No homicidal or suicidal ideation.  Heme: Negative for prolonged bleeding, bruising easily, and swollen nodes. Endocrine: Negative for cold or heat intolerance, polyuria, polydipsia and goiter. Neuro: negative for tremor, gait imbalance, syncope and seizures. The remainder of the review of systems is noncontributory.  Physical Exam: BP (!) 156/77 (BP Location: Right Arm, Patient Position: Sitting, Cuff Size: Large)   Pulse (!) 58   Temp 98 F (36.7 C) (Oral)   Ht '5\' 8"'$  (1.727 m)   Wt 219 lb (99.3 kg)   BMI 33.30 kg/m  General:   Alert and oriented. No distress noted. Pleasant and cooperative.  Head:  Normocephalic and atraumatic. Eyes:  Conjuctiva clear without scleral icterus. Mouth:  Oral mucosa pink and moist. Good dentition. No lesions. Heart: Normal rate and rhythm, s1 and s2 heart sounds present.  Lungs: Clear lung sounds in all lobes. Respirations equal and unlabored. Abdomen:  +BS, soft, non-tender and non-distended. No rebound or guarding. No HSM or masses noted. Derm: No palmar erythema or  jaundice Msk:  Symmetrical without gross deformities. Normal posture. Extremities:  Without edema. Neurologic:  Alert and  oriented x4 Psych:  Alert and cooperative. Normal mood and affect.  Invalid input(s): "6 MONTHS"   ASSESSMENT: Christopher Conley is a 66 y.o. male presenting today as a new patient for diarrhea and to update surveillance colonoscopy.  Diarrhea was self limiting with 2-3 episodes per day beginning 3 months ago with last episode about 3-4 weeks ago. Denies any new medications or antibiotics, no associated symptoms, weight loss, rectal bleeding or melena. Diarrhea possibly self limiting viral illness or could be secondary to constipation as he reported BMs 1-2x/week prior to that. Now having formed, harder stools, having to strain some when he goes but less than previously.  Discussed increasing water, fruits, veggies and whole grains and starting benefiber 1T TID with meals. He will make me aware if he has recurrent diarrhea.  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 He is amenable to updating colonoscopy. Hx of COPD but does not wear supplemental O2 or require any breathing apparatus at home. Indications, risks and benefits of procedure discussed in detail with patient. Patient verbalized understanding and is in agreement to proceed with Colonoscopy at this time.    PLAN:  Schedule colonoscopy, ENDO 3, ASA III 2. Pt to make me aware of recurrent diarrhea  3. Increase water, fruits, veggies and whole grains 4. Benefiber 1T TID with meals 5. Continue smoking cessation   All questions were answered, patient verbalized understanding and is in agreement with plan as outlined above.    Follow Up: 3 months   Raijon Lindfors L. Alver Sorrow, MSN, APRN, AGNP-C Adult-Gerontology Nurse Practitioner [facility]

## 2022-04-08 ENCOUNTER — Encounter (INDEPENDENT_AMBULATORY_CARE_PROVIDER_SITE_OTHER): Payer: Self-pay

## 2022-04-14 DIAGNOSIS — I10 Essential (primary) hypertension: Secondary | ICD-10-CM | POA: Diagnosis not present

## 2022-04-14 DIAGNOSIS — I700 Atherosclerosis of aorta: Secondary | ICD-10-CM | POA: Diagnosis not present

## 2022-04-14 DIAGNOSIS — Z6832 Body mass index (BMI) 32.0-32.9, adult: Secondary | ICD-10-CM | POA: Diagnosis not present

## 2022-04-14 DIAGNOSIS — R69 Illness, unspecified: Secondary | ICD-10-CM | POA: Diagnosis not present

## 2022-04-14 DIAGNOSIS — J449 Chronic obstructive pulmonary disease, unspecified: Secondary | ICD-10-CM | POA: Diagnosis not present

## 2022-04-28 DIAGNOSIS — M545 Low back pain, unspecified: Secondary | ICD-10-CM | POA: Diagnosis not present

## 2022-04-28 DIAGNOSIS — I509 Heart failure, unspecified: Secondary | ICD-10-CM | POA: Diagnosis not present

## 2022-04-28 DIAGNOSIS — R69 Illness, unspecified: Secondary | ICD-10-CM | POA: Diagnosis not present

## 2022-04-28 DIAGNOSIS — R102 Pelvic and perineal pain: Secondary | ICD-10-CM | POA: Diagnosis not present

## 2022-04-28 DIAGNOSIS — Z79899 Other long term (current) drug therapy: Secondary | ICD-10-CM | POA: Diagnosis not present

## 2022-04-28 DIAGNOSIS — I2510 Atherosclerotic heart disease of native coronary artery without angina pectoris: Secondary | ICD-10-CM | POA: Diagnosis not present

## 2022-04-28 DIAGNOSIS — J449 Chronic obstructive pulmonary disease, unspecified: Secondary | ICD-10-CM | POA: Diagnosis not present

## 2022-05-07 NOTE — Patient Instructions (Signed)
Christopher Conley  05/07/2022     '@PREFPERIOPPHARMACY'$ @   Your procedure is scheduled on  05/13/2022.   Report to [facility] at 0700  A.M.   Call this number if you have problems the morning of surgery:  [phone]   Follow the diet and prep instructions given to you by the office.        Use your inhaler before you come and bring your rescue inhaler with you.     Take these medicines the morning of surgery with A SIP OF WATER                                                 Celexa, lopressor.     Do not wear jewelry, make-up or nail polish.  Do not wear lotions, powders, or perfumes, or deodorant.  Do not shave 48 hours prior to surgery.  Men may shave face and neck.  Do not bring valuables to the hospital.  [facility] is not responsible for any belongings or valuables.  Contacts, dentures or bridgework may not be worn into surgery.  Leave your suitcase in the car.  After surgery it may be brought to your room.  For patients admitted to the hospital, discharge time will be determined by your treatment team.  Patients discharged the day of surgery will not be allowed to drive home and must have someone with them for 24 hours.    Special instructions:   DO NOT smoke tobacco or vape fore 24 hours before your procedure.  Please read over the following fact sheets that you were given. Anesthesia Post-op Instructions and Care and Recovery After Surgery      Colonoscopy, Adult, Care After The following information offers guidance on how to care for yourself after your procedure. Your health care provider may also give you more specific instructions. If you have problems or questions, contact your health care provider. What can I expect after the procedure? After the procedure, it is common to have: A small amount of blood in your stool for 24 hours after the procedure. Some gas. Mild cramping or bloating of your abdomen. Follow these instructions at home: Eating  and drinking  Drink enough fluid to keep your urine pale yellow. Follow instructions from your health care provider about eating or drinking restrictions. Resume your normal diet as told by your health care provider. Avoid heavy or fried foods that are hard to digest. Activity Rest as told by your health care provider. Avoid sitting for a long time without moving. Get up to take short walks every 1-2 hours. This is important to improve blood flow and breathing. Ask for help if you feel weak or unsteady. Return to your normal activities as told by your health care provider. Ask your health care provider what activities are safe for you. Managing cramping and bloating  Try walking around when you have cramps or feel bloated. If directed, apply heat to your abdomen as told by your health care provider. Use the heat source that your health care provider recommends, such as a moist heat pack or a heating pad. Place a towel between your skin and the heat source. Leave the heat on for 20-30 minutes. Remove the heat if your skin turns bright red. This is especially important if you are unable to  feel pain, heat, or cold. You have a greater risk of getting burned. General instructions If you were given a sedative during the procedure, it can affect you for several hours. Do not drive or operate machinery until your health care provider says that it is safe. For the first 24 hours after the procedure: Do not sign important documents. Do not drink alcohol. Do your regular daily activities at a slower pace than normal. Eat soft foods that are easy to digest. Take over-the-counter and prescription medicines only as told by your health care provider. Keep all follow-up visits. This is important. Contact a health care provider if: You have blood in your stool 2-3 days after the procedure. Get help right away if: You have more than a small spotting of blood in your stool. You have large blood clots in  your stool. You have swelling of your abdomen. You have nausea or vomiting. You have a fever. You have increasing pain in your abdomen that is not relieved with medicine. These symptoms may be an emergency. Get help right away. Call 911. Do not wait to see if the symptoms will go away. Do not drive yourself to the hospital. Summary After the procedure, it is common to have a small amount of blood in your stool. You may also have mild cramping and bloating of your abdomen. If you were given a sedative during the procedure, it can affect you for several hours. Do not drive or operate machinery until your health care provider says that it is safe. Get help right away if you have a lot of blood in your stool, nausea or vomiting, a fever, or increased pain in your abdomen. This information is not intended to replace advice given to you by your health care provider. Make sure you discuss any questions you have with your health care provider. Document Revised: 04/24/2021 Document Reviewed: 04/24/2021 Elsevier Patient Education  [place] After This sheet gives you information about how to care for yourself after your procedure. Your health care provider may also give you more specific instructions. If you have problems or questions, contact your health care provider. What can I expect after the procedure? After the procedure, it is common to have: Tiredness. Forgetfulness about what happened after the procedure. Impaired judgment for important decisions. Nausea or vomiting. Some difficulty with balance. Follow these instructions at home: For the time period you were told by your health care provider:     Rest as needed. Do not participate in activities where you could fall or become injured. Do not drive or use machinery. Do not drink alcohol. Do not take sleeping pills or medicines that cause drowsiness. Do not make important decisions or sign  legal documents. Do not take care of children on your own. Eating and drinking Follow the diet that is recommended by your health care provider. Drink enough fluid to keep your urine pale yellow. If you vomit: Drink water, juice, or soup when you can drink without vomiting. Make sure you have little or no nausea before eating solid foods. General instructions Have a responsible adult stay with you for the time you are told. It is important to have someone help care for you until you are awake and alert. Take over-the-counter and prescription medicines only as told by your health care provider. If you have sleep apnea, surgery and certain medicines can increase your risk for breathing problems. Follow instructions from your health care provider about wearing  your sleep device: Anytime you are sleeping, including during daytime naps. While taking prescription pain medicines, sleeping medicines, or medicines that make you drowsy. Avoid smoking. Keep all follow-up visits as told by your health care provider. This is important. Contact a health care provider if: You keep feeling nauseous or you keep vomiting. You feel light-headed. You are still sleepy or having trouble with balance after 24 hours. You develop a rash. You have a fever. You have redness or swelling around the IV site. Get help right away if: You have trouble breathing. You have new-onset confusion at home. Summary For several hours after your procedure, you may feel tired. You may also be forgetful and have poor judgment. Have a responsible adult stay with you for the time you are told. It is important to have someone help care for you until you are awake and alert. Rest as told. Do not drive or operate machinery. Do not drink alcohol or take sleeping pills. Get help right away if you have trouble breathing, or if you suddenly become confused. This information is not intended to replace advice given to you by your health  care provider. Make sure you discuss any questions you have with your health care provider. Document Revised: 08/06/2021 Document Reviewed: 08/04/2019 Elsevier Patient Education  [place].

## 2022-05-08 ENCOUNTER — Other Ambulatory Visit: Payer: Self-pay

## 2022-05-08 ENCOUNTER — Encounter (HOSPITAL_COMMUNITY): Payer: Self-pay

## 2022-05-08 ENCOUNTER — Encounter (HOSPITAL_COMMUNITY)
Admission: RE | Admit: 2022-05-08 | Discharge: 2022-05-08 | Disposition: A | Discharge status: Home / Self Care | Payer: Medicare HMO | Source: Ambulatory Visit | Attending: Gastroenterology | Admitting: Gastroenterology

## 2022-05-08 VITALS — BP 162/91 | HR 64 | Temp 97.8°F | Resp 24 | Ht 68.0 in | Wt 218.9 lb

## 2022-05-08 DIAGNOSIS — I2510 Atherosclerotic heart disease of native coronary artery without angina pectoris: Secondary | ICD-10-CM | POA: Insufficient documentation

## 2022-05-08 DIAGNOSIS — D126 Benign neoplasm of colon, unspecified: Secondary | ICD-10-CM | POA: Insufficient documentation

## 2022-05-08 DIAGNOSIS — Z01810 Encounter for preprocedural cardiovascular examination: Secondary | ICD-10-CM | POA: Diagnosis not present

## 2022-05-13 ENCOUNTER — Ambulatory Visit (HOSPITAL_COMMUNITY): Payer: Medicare HMO | Admitting: Anesthesiology

## 2022-05-13 ENCOUNTER — Encounter (HOSPITAL_COMMUNITY): Payer: Self-pay | Admitting: Gastroenterology

## 2022-05-13 ENCOUNTER — Ambulatory Visit (HOSPITAL_COMMUNITY)
Admission: RE | Admit: 2022-05-13 | Discharge: 2022-05-13 | Disposition: A | Discharge status: Home / Self Care | Payer: Medicare HMO | Attending: Gastroenterology | Admitting: Gastroenterology

## 2022-05-13 ENCOUNTER — Encounter (HOSPITAL_COMMUNITY)
Admission: RE | Disposition: A | Discharge status: Home / Self Care | Payer: Self-pay | Source: Home / Self Care | Attending: Gastroenterology

## 2022-05-13 DIAGNOSIS — Z538 Procedure and treatment not carried out for other reasons: Secondary | ICD-10-CM | POA: Diagnosis not present

## 2022-05-13 DIAGNOSIS — Z86010 Personal history of colonic polyps: Secondary | ICD-10-CM | POA: Diagnosis not present

## 2022-05-13 DIAGNOSIS — D126 Benign neoplasm of colon, unspecified: Secondary | ICD-10-CM

## 2022-05-13 DIAGNOSIS — Z1211 Encounter for screening for malignant neoplasm of colon: Secondary | ICD-10-CM | POA: Insufficient documentation

## 2022-05-13 SURGERY — COLONOSCOPY WITH PROPOFOL
Anesthesia: General

## 2022-05-13 NOTE — Progress Notes (Signed)
Procedure cancelled as patient took effient on Saturday, will be rescheduled - needs to obtain clearance to stop Effient for 7 days.

## 2022-05-13 NOTE — Anesthesia Preprocedure Evaluation (Signed)
Anesthesia Evaluation  Patient identified by MRN, date of birth, ID band Patient awake    Reviewed: Allergy & Precautions, H&P , NPO status , Patient's Chart, lab work & pertinent test results, reviewed documented beta blocker date and time   Airway Mallampati: II  TM Distance: >3 FB Neck ROM: full    Dental no notable dental hx.    Pulmonary COPD, Current Smoker,    Pulmonary exam normal breath sounds clear to auscultation       Cardiovascular Exercise Tolerance: Good hypertension, + CAD and + Past MI   Rhythm:regular Rate:Normal     Neuro/Psych PSYCHIATRIC DISORDERS Bipolar Disorder negative neurological ROS     GI/Hepatic negative GI ROS, Neg liver ROS,   Endo/Other  negative endocrine ROS  Renal/GU negative Renal ROS  negative genitourinary   Musculoskeletal   Abdominal   Peds  Hematology negative hematology ROS (+)   Anesthesia Other Findings   Reproductive/Obstetrics negative OB ROS                             Anesthesia Physical Anesthesia Plan  ASA: 3  Anesthesia Plan: General   Post-op Pain Management:    Induction:   PONV Risk Score and Plan: Propofol infusion  Airway Management Planned:   Additional Equipment:   Intra-op Plan:   Post-operative Plan:   Informed Consent: I have reviewed the patients History and Physical, chart, labs and discussed the procedure including the risks, benefits and alternatives for the proposed anesthesia with the patient or authorized representative who has indicated his/her understanding and acceptance.     Dental Advisory Given  Plan Discussed with: CRNA  Anesthesia Plan Comments:         Anesthesia Quick Evaluation

## 2022-05-14 ENCOUNTER — Telehealth (INDEPENDENT_AMBULATORY_CARE_PROVIDER_SITE_OTHER): Payer: Self-pay

## 2022-05-14 ENCOUNTER — Other Ambulatory Visit (INDEPENDENT_AMBULATORY_CARE_PROVIDER_SITE_OTHER): Payer: Self-pay

## 2022-05-14 ENCOUNTER — Encounter (INDEPENDENT_AMBULATORY_CARE_PROVIDER_SITE_OTHER): Payer: Self-pay

## 2022-05-14 DIAGNOSIS — D126 Benign neoplasm of colon, unspecified: Secondary | ICD-10-CM

## 2022-05-14 NOTE — Telephone Encounter (Signed)
-----   Message from Harvel Quale, MD sent at 05/13/2022  8:16 AM EDT ----- Please reschedule him for colonoscopy - this is the patient that is on Effient. Needs to have cardiology clearance by the cardiology clinic to stop Effient for 7 days.  Thanks

## 2022-05-14 NOTE — Telephone Encounter (Signed)
Thank you :)

## 2022-05-14 NOTE — Telephone Encounter (Signed)
I have Mr Christopher Conley rescheduled for 06/27/22 and I have reached out to Cardiology in regards to holding his effient 7 days prior patient is aware

## 2022-05-15 ENCOUNTER — Telehealth (INDEPENDENT_AMBULATORY_CARE_PROVIDER_SITE_OTHER): Payer: Self-pay

## 2022-05-15 ENCOUNTER — Encounter (INDEPENDENT_AMBULATORY_CARE_PROVIDER_SITE_OTHER): Payer: Self-pay

## 2022-05-15 MED ORDER — PEG 3350-KCL-NA BICARB-NACL 420 G PO SOLR: 4000.0000 mL | ORAL | 0 refills | Status: DC

## 2022-05-15 NOTE — Telephone Encounter (Signed)
Per Dr Rosalita Levan dob 14-Mar-2056 may hold his Effient 7 days prior to his procedure and resume immediately after and patient is aware.

## 2022-05-15 NOTE — Telephone Encounter (Signed)
Jovan Schickling Ann Yeraldin Litzenberger, CMA  ?

## 2022-05-15 NOTE — Telephone Encounter (Signed)
Excellent, thanks so much! °

## 2022-07-01 DIAGNOSIS — M5451 Vertebrogenic low back pain: Secondary | ICD-10-CM | POA: Diagnosis not present

## 2022-07-04 DIAGNOSIS — M545 Low back pain, unspecified: Secondary | ICD-10-CM | POA: Diagnosis not present

## 2022-07-08 ENCOUNTER — Ambulatory Visit (INDEPENDENT_AMBULATORY_CARE_PROVIDER_SITE_OTHER): Payer: Medicare HMO | Admitting: Gastroenterology

## 2022-07-10 DIAGNOSIS — M545 Low back pain, unspecified: Secondary | ICD-10-CM | POA: Diagnosis not present

## 2022-07-15 DIAGNOSIS — M545 Low back pain, unspecified: Secondary | ICD-10-CM | POA: Diagnosis not present

## 2022-07-17 DIAGNOSIS — M545 Low back pain, unspecified: Secondary | ICD-10-CM | POA: Diagnosis not present

## 2022-07-21 DIAGNOSIS — M545 Low back pain, unspecified: Secondary | ICD-10-CM | POA: Diagnosis not present

## 2022-07-24 DIAGNOSIS — M545 Low back pain, unspecified: Secondary | ICD-10-CM | POA: Diagnosis not present

## 2022-07-30 DIAGNOSIS — M545 Low back pain, unspecified: Secondary | ICD-10-CM | POA: Diagnosis not present

## 2022-08-01 DIAGNOSIS — M545 Low back pain, unspecified: Secondary | ICD-10-CM | POA: Diagnosis not present

## 2022-08-11 DIAGNOSIS — J449 Chronic obstructive pulmonary disease, unspecified: Secondary | ICD-10-CM | POA: Diagnosis not present

## 2022-08-11 DIAGNOSIS — J0191 Acute recurrent sinusitis, unspecified: Secondary | ICD-10-CM | POA: Diagnosis not present

## 2022-08-11 DIAGNOSIS — I700 Atherosclerosis of aorta: Secondary | ICD-10-CM | POA: Diagnosis not present

## 2022-08-11 DIAGNOSIS — F418 Other specified anxiety disorders: Secondary | ICD-10-CM | POA: Diagnosis not present

## 2022-08-11 DIAGNOSIS — R69 Illness, unspecified: Secondary | ICD-10-CM | POA: Diagnosis not present

## 2022-08-11 DIAGNOSIS — Z6831 Body mass index (BMI) 31.0-31.9, adult: Secondary | ICD-10-CM | POA: Diagnosis not present

## 2022-08-11 DIAGNOSIS — I10 Essential (primary) hypertension: Secondary | ICD-10-CM | POA: Diagnosis not present

## 2022-08-12 DIAGNOSIS — D6869 Other thrombophilia: Secondary | ICD-10-CM | POA: Diagnosis not present

## 2022-08-12 DIAGNOSIS — M533 Sacrococcygeal disorders, not elsewhere classified: Secondary | ICD-10-CM | POA: Diagnosis not present

## 2022-08-12 DIAGNOSIS — M961 Postlaminectomy syndrome, not elsewhere classified: Secondary | ICD-10-CM | POA: Diagnosis not present

## 2022-08-12 DIAGNOSIS — I2510 Atherosclerotic heart disease of native coronary artery without angina pectoris: Secondary | ICD-10-CM | POA: Diagnosis not present

## 2022-08-19 ENCOUNTER — Encounter (HOSPITAL_COMMUNITY): Payer: Self-pay | Admitting: Anesthesiology

## 2022-08-20 ENCOUNTER — Ambulatory Visit (HOSPITAL_COMMUNITY): Admission: RE | Admit: 2022-08-20 | Payer: Medicare HMO | Source: Home / Self Care | Admitting: Gastroenterology

## 2022-08-20 ENCOUNTER — Encounter (HOSPITAL_COMMUNITY): Admission: RE | Payer: Self-pay | Source: Home / Self Care

## 2022-08-20 ENCOUNTER — Telehealth (INDEPENDENT_AMBULATORY_CARE_PROVIDER_SITE_OTHER): Payer: Self-pay | Admitting: *Deleted

## 2022-08-20 DIAGNOSIS — D126 Benign neoplasm of colon, unspecified: Secondary | ICD-10-CM

## 2022-08-20 DIAGNOSIS — M533 Sacrococcygeal disorders, not elsewhere classified: Secondary | ICD-10-CM | POA: Diagnosis not present

## 2022-08-20 SURGERY — COLONOSCOPY WITH PROPOFOL
Anesthesia: Monitor Anesthesia Care

## 2022-08-20 NOTE — Telephone Encounter (Signed)
Called pt, LMOVM to call back. 

## 2022-08-20 NOTE — Telephone Encounter (Signed)
Anitra from Endo called, patient didn't;t show for TCS today, when she called him he stated he had an apt in [place] for his back today to he went to that. He will need to be rescheduled

## 2022-09-12 DIAGNOSIS — E669 Obesity, unspecified: Secondary | ICD-10-CM | POA: Diagnosis not present

## 2022-09-12 DIAGNOSIS — I10 Essential (primary) hypertension: Secondary | ICD-10-CM | POA: Diagnosis not present

## 2022-09-12 DIAGNOSIS — I2510 Atherosclerotic heart disease of native coronary artery without angina pectoris: Secondary | ICD-10-CM | POA: Diagnosis not present

## 2022-09-12 DIAGNOSIS — J449 Chronic obstructive pulmonary disease, unspecified: Secondary | ICD-10-CM | POA: Diagnosis not present

## 2022-09-12 DIAGNOSIS — Z5986 Financial insecurity: Secondary | ICD-10-CM | POA: Diagnosis not present

## 2022-09-12 DIAGNOSIS — Z6833 Body mass index (BMI) 33.0-33.9, adult: Secondary | ICD-10-CM | POA: Diagnosis not present

## 2022-09-12 DIAGNOSIS — Z5948 Other specified lack of adequate food: Secondary | ICD-10-CM | POA: Diagnosis not present

## 2022-09-12 DIAGNOSIS — R32 Unspecified urinary incontinence: Secondary | ICD-10-CM | POA: Diagnosis not present

## 2022-09-12 DIAGNOSIS — R0602 Shortness of breath: Secondary | ICD-10-CM | POA: Diagnosis not present

## 2022-09-12 DIAGNOSIS — N400 Enlarged prostate without lower urinary tract symptoms: Secondary | ICD-10-CM | POA: Diagnosis not present

## 2022-09-12 DIAGNOSIS — R69 Illness, unspecified: Secondary | ICD-10-CM | POA: Diagnosis not present

## 2022-09-12 DIAGNOSIS — E785 Hyperlipidemia, unspecified: Secondary | ICD-10-CM | POA: Diagnosis not present

## 2022-09-18 ENCOUNTER — Inpatient Hospital Stay (HOSPITAL_COMMUNITY)
Admission: EM | Admit: 2022-09-18 | Discharge: 2022-09-25 | DRG: 190 | Disposition: A | Discharge status: Home / Self Care | Payer: Medicare HMO | Attending: Family Medicine | Admitting: Family Medicine

## 2022-09-18 ENCOUNTER — Emergency Department (HOSPITAL_COMMUNITY): Payer: Medicare HMO

## 2022-09-18 ENCOUNTER — Encounter (HOSPITAL_COMMUNITY): Payer: Self-pay | Admitting: *Deleted

## 2022-09-18 ENCOUNTER — Other Ambulatory Visit: Payer: Self-pay

## 2022-09-18 DIAGNOSIS — J9811 Atelectasis: Secondary | ICD-10-CM | POA: Diagnosis not present

## 2022-09-18 DIAGNOSIS — I10 Essential (primary) hypertension: Secondary | ICD-10-CM | POA: Diagnosis not present

## 2022-09-18 DIAGNOSIS — E669 Obesity, unspecified: Secondary | ICD-10-CM | POA: Diagnosis not present

## 2022-09-18 DIAGNOSIS — R0609 Other forms of dyspnea: Secondary | ICD-10-CM | POA: Diagnosis not present

## 2022-09-18 DIAGNOSIS — I252 Old myocardial infarction: Secondary | ICD-10-CM | POA: Diagnosis not present

## 2022-09-18 DIAGNOSIS — J441 Chronic obstructive pulmonary disease with (acute) exacerbation: Principal | ICD-10-CM | POA: Diagnosis present

## 2022-09-18 DIAGNOSIS — Z825 Family history of asthma and other chronic lower respiratory diseases: Secondary | ICD-10-CM | POA: Diagnosis not present

## 2022-09-18 DIAGNOSIS — Z981 Arthrodesis status: Secondary | ICD-10-CM | POA: Diagnosis not present

## 2022-09-18 DIAGNOSIS — R058 Other specified cough: Secondary | ICD-10-CM

## 2022-09-18 DIAGNOSIS — Z1152 Encounter for screening for COVID-19: Secondary | ICD-10-CM

## 2022-09-18 DIAGNOSIS — J9601 Acute respiratory failure with hypoxia: Principal | ICD-10-CM

## 2022-09-18 DIAGNOSIS — Z6834 Body mass index (BMI) 34.0-34.9, adult: Secondary | ICD-10-CM

## 2022-09-18 DIAGNOSIS — Z743 Need for continuous supervision: Secondary | ICD-10-CM | POA: Diagnosis not present

## 2022-09-18 DIAGNOSIS — R062 Wheezing: Secondary | ICD-10-CM | POA: Diagnosis not present

## 2022-09-18 DIAGNOSIS — R69 Illness, unspecified: Secondary | ICD-10-CM | POA: Diagnosis not present

## 2022-09-18 DIAGNOSIS — F17210 Nicotine dependence, cigarettes, uncomplicated: Secondary | ICD-10-CM | POA: Diagnosis present

## 2022-09-18 DIAGNOSIS — E785 Hyperlipidemia, unspecified: Secondary | ICD-10-CM | POA: Diagnosis present

## 2022-09-18 DIAGNOSIS — M1990 Unspecified osteoarthritis, unspecified site: Secondary | ICD-10-CM | POA: Diagnosis not present

## 2022-09-18 DIAGNOSIS — Z720 Tobacco use: Secondary | ICD-10-CM | POA: Insufficient documentation

## 2022-09-18 DIAGNOSIS — Z7902 Long term (current) use of antithrombotics/antiplatelets: Secondary | ICD-10-CM

## 2022-09-18 DIAGNOSIS — Z79899 Other long term (current) drug therapy: Secondary | ICD-10-CM | POA: Diagnosis not present

## 2022-09-18 DIAGNOSIS — F319 Bipolar disorder, unspecified: Secondary | ICD-10-CM | POA: Diagnosis not present

## 2022-09-18 DIAGNOSIS — R069 Unspecified abnormalities of breathing: Secondary | ICD-10-CM | POA: Diagnosis not present

## 2022-09-18 DIAGNOSIS — R0600 Dyspnea, unspecified: Secondary | ICD-10-CM | POA: Diagnosis not present

## 2022-09-18 DIAGNOSIS — Z8249 Family history of ischemic heart disease and other diseases of the circulatory system: Secondary | ICD-10-CM

## 2022-09-18 DIAGNOSIS — I2510 Atherosclerotic heart disease of native coronary artery without angina pectoris: Secondary | ICD-10-CM | POA: Diagnosis present

## 2022-09-18 DIAGNOSIS — R0602 Shortness of breath: Secondary | ICD-10-CM | POA: Diagnosis not present

## 2022-09-18 LAB — BLOOD GAS, VENOUS
Acid-Base Excess: 4.2 mmol/L — ABNORMAL HIGH (ref 0.0–2.0)
Bicarbonate: 29.2 mmol/L — ABNORMAL HIGH (ref 20.0–28.0)
Drawn by: 67470
O2 Saturation: 90.3 %
Patient temperature: 37.1
pCO2, Ven: 44 mmHg (ref 44–60)
pH, Ven: 7.43 (ref 7.25–7.43)
pO2, Ven: 58 mmHg — ABNORMAL HIGH (ref 32–45)

## 2022-09-18 LAB — CBC WITH DIFFERENTIAL/PLATELET
Abs Immature Granulocytes: 0.04 10*3/uL (ref 0.00–0.07)
Basophils Absolute: 0.1 10*3/uL (ref 0.0–0.1)
Basophils Relative: 1 %
Eosinophils Absolute: 0.5 10*3/uL (ref 0.0–0.5)
Eosinophils Relative: 5 %
HCT: 49.8 % (ref 39.0–52.0)
Hemoglobin: 16 g/dL (ref 13.0–17.0)
Immature Granulocytes: 0 %
Lymphocytes Relative: 18 %
Lymphs Abs: 1.8 10*3/uL (ref 0.7–4.0)
MCH: 30.2 pg (ref 26.0–34.0)
MCHC: 32.1 g/dL (ref 30.0–36.0)
MCV: 94.1 fL (ref 80.0–100.0)
Monocytes Absolute: 0.5 10*3/uL (ref 0.1–1.0)
Monocytes Relative: 5 %
Neutro Abs: 7 10*3/uL (ref 1.7–7.7)
Neutrophils Relative %: 71 %
Platelets: 243 10*3/uL (ref 150–400)
RBC: 5.29 MIL/uL (ref 4.22–5.81)
RDW: 13.4 % (ref 11.5–15.5)
WBC: 9.9 10*3/uL (ref 4.0–10.5)
nRBC: 0 % (ref 0.0–0.2)

## 2022-09-18 LAB — BASIC METABOLIC PANEL
Anion gap: 9 (ref 5–15)
BUN: 18 mg/dL (ref 8–23)
CO2: 25 mmol/L (ref 22–32)
Calcium: 8.8 mg/dL — ABNORMAL LOW (ref 8.9–10.3)
Chloride: 104 mmol/L (ref 98–111)
Creatinine, Ser: 0.81 mg/dL (ref 0.61–1.24)
GFR, Estimated: >60 mL/min (ref >60)
Glucose, Bld: 94 mg/dL (ref 70–99)
Potassium: 4.6 mmol/L (ref 3.5–5.1)
Sodium: 138 mmol/L (ref 135–145)

## 2022-09-18 LAB — BRAIN NATRIURETIC PEPTIDE: B Natriuretic Peptide: 14 pg/mL (ref 0.0–100.0)

## 2022-09-18 LAB — TROPONIN I (HIGH SENSITIVITY): Troponin I (High Sensitivity): 5 ng/L (ref <18)

## 2022-09-18 LAB — RESP PANEL BY RT-PCR (RSV, FLU A&B, COVID)  RVPGX2
Influenza A by PCR: NEGATIVE
Influenza B by PCR: NEGATIVE
Resp Syncytial Virus by PCR: NEGATIVE
SARS Coronavirus 2 by RT PCR: NEGATIVE

## 2022-09-18 MED ORDER — TAMSULOSIN HCL 0.4 MG PO CAPS
0.4000 mg | ORAL_CAPSULE | Freq: Every day | ORAL | Status: DC
  Administered 2022-09-19 – 2022-09-25 (×7): 0.4 mg via ORAL
  Filled 2022-09-18 (×7): qty 1

## 2022-09-18 MED ORDER — CITALOPRAM HYDROBROMIDE 20 MG PO TABS: 20.0000 mg | ORAL_TABLET | Freq: Every day | ORAL | Status: DC

## 2022-09-18 MED ORDER — ACETAMINOPHEN 650 MG RE SUPP: 650.0000 mg | Freq: Four times a day (QID) | RECTAL | Status: DC | PRN

## 2022-09-18 MED ORDER — ATORVASTATIN CALCIUM 20 MG PO TABS
20.0000 mg | ORAL_TABLET | Freq: Every day | ORAL | Status: DC
  Administered 2022-09-19 – 2022-09-25 (×7): 20 mg via ORAL
  Filled 2022-09-18 (×7): qty 1

## 2022-09-18 MED ORDER — METOPROLOL TARTRATE 25 MG PO TABS
25.0000 mg | ORAL_TABLET | Freq: Two times a day (BID) | ORAL | Status: DC
  Administered 2022-09-19 – 2022-09-20 (×3): 25 mg via ORAL
  Filled 2022-09-18 (×3): qty 1

## 2022-09-18 MED ORDER — ALBUTEROL (5 MG/ML) CONTINUOUS INHALATION SOLN: 7.5000 mg/h | INHALATION_SOLUTION | Freq: Once | RESPIRATORY_TRACT | Status: DC

## 2022-09-18 MED ORDER — METOPROLOL TARTRATE 25 MG PO TABS
25.0000 mg | ORAL_TABLET | Freq: Once | ORAL | Status: AC
  Administered 2022-09-18: 25 mg via ORAL
  Filled 2022-09-18: qty 1

## 2022-09-18 MED ORDER — METHYLPREDNISOLONE SODIUM SUCC 125 MG IJ SOLR
80.0000 mg | Freq: Two times a day (BID) | INTRAMUSCULAR | Status: AC
  Administered 2022-09-18 – 2022-09-19 (×2): 80 mg via INTRAVENOUS
  Filled 2022-09-18 (×2): qty 2

## 2022-09-18 MED ORDER — ONDANSETRON HCL 4 MG/2ML IJ SOLN: 4.0000 mg | Freq: Four times a day (QID) | INTRAMUSCULAR | Status: DC | PRN

## 2022-09-18 MED ORDER — NICOTINE 21 MG/24HR TD PT24
21.0000 mg | MEDICATED_PATCH | Freq: Every day | TRANSDERMAL | Status: DC
  Administered 2022-09-18 – 2022-09-25 (×8): 21 mg via TRANSDERMAL
  Filled 2022-09-18 (×8): qty 1

## 2022-09-18 MED ORDER — ENOXAPARIN SODIUM 40 MG/0.4ML IJ SOSY
40.0000 mg | PREFILLED_SYRINGE | INTRAMUSCULAR | Status: DC
  Administered 2022-09-18 – 2022-09-24 (×7): 40 mg via SUBCUTANEOUS
  Filled 2022-09-18 (×7): qty 0.4

## 2022-09-18 MED ORDER — ALBUTEROL (5 MG/ML) CONTINUOUS INHALATION SOLN: 10.0000 mg/h | INHALATION_SOLUTION | RESPIRATORY_TRACT | Status: DC

## 2022-09-18 MED ORDER — SIMVASTATIN 10 MG PO TABS: 40.0000 mg | ORAL_TABLET | Freq: Every day | ORAL | Status: DC

## 2022-09-18 MED ORDER — MAGNESIUM SULFATE 2 GM/50ML IV SOLN
2.0000 g | Freq: Once | INTRAVENOUS | Status: AC
  Administered 2022-09-18: 2 g via INTRAVENOUS
  Filled 2022-09-18: qty 50

## 2022-09-18 MED ORDER — ALBUTEROL SULFATE (2.5 MG/3ML) 0.083% IN NEBU
2.5000 mg | INHALATION_SOLUTION | RESPIRATORY_TRACT | Status: DC | PRN
  Administered 2022-09-19 – 2022-09-20 (×4): 2.5 mg via RESPIRATORY_TRACT
  Filled 2022-09-18 (×4): qty 3

## 2022-09-18 MED ORDER — ALBUTEROL SULFATE (2.5 MG/3ML) 0.083% IN NEBU
INHALATION_SOLUTION | RESPIRATORY_TRACT | Status: AC
  Administered 2022-09-18: 10 mg
  Filled 2022-09-18: qty 12

## 2022-09-18 MED ORDER — PANTOPRAZOLE SODIUM 40 MG PO TBEC
40.0000 mg | DELAYED_RELEASE_TABLET | Freq: Every day | ORAL | Status: DC
  Administered 2022-09-18 – 2022-09-25 (×8): 40 mg via ORAL
  Filled 2022-09-18 (×8): qty 1

## 2022-09-18 MED ORDER — PREDNISONE 20 MG PO TABS
40.0000 mg | ORAL_TABLET | Freq: Every day | ORAL | Status: DC
  Administered 2022-09-20: 40 mg via ORAL
  Filled 2022-09-18: qty 2

## 2022-09-18 MED ORDER — ONDANSETRON HCL 4 MG PO TABS: 4.0000 mg | ORAL_TABLET | Freq: Four times a day (QID) | ORAL | Status: DC | PRN

## 2022-09-18 MED ORDER — PREDNISONE 50 MG PO TABS: 50.0000 mg | ORAL_TABLET | Freq: Every day | ORAL | 0 refills | Status: DC

## 2022-09-18 MED ORDER — SODIUM CHLORIDE 0.9 % IV SOLN
1.0000 g | INTRAVENOUS | Status: AC
  Administered 2022-09-18 – 2022-09-22 (×5): 1 g via INTRAVENOUS
  Filled 2022-09-18 (×5): qty 10

## 2022-09-18 MED ORDER — PRASUGREL HCL 10 MG PO TABS
10.0000 mg | ORAL_TABLET | Freq: Every day | ORAL | Status: DC
  Administered 2022-09-19 – 2022-09-25 (×7): 10 mg via ORAL
  Filled 2022-09-18 (×8): qty 1

## 2022-09-18 MED ORDER — IPRATROPIUM-ALBUTEROL 0.5-2.5 (3) MG/3ML IN SOLN
3.0000 mL | Freq: Four times a day (QID) | RESPIRATORY_TRACT | Status: DC
  Administered 2022-09-18 – 2022-09-23 (×17): 3 mL via RESPIRATORY_TRACT
  Filled 2022-09-18 (×17): qty 3

## 2022-09-18 MED ORDER — ACETAMINOPHEN 325 MG PO TABS
650.0000 mg | ORAL_TABLET | Freq: Four times a day (QID) | ORAL | Status: DC | PRN
  Administered 2022-09-24: 650 mg via ORAL
  Filled 2022-09-18: qty 2

## 2022-09-18 MED ORDER — AZITHROMYCIN 250 MG PO TABS: 250.0000 mg | ORAL_TABLET | Freq: Every day | ORAL | 0 refills | Status: DC

## 2022-09-18 MED ORDER — TRAZODONE HCL 50 MG PO TABS: 25.0000 mg | ORAL_TABLET | Freq: Every evening | ORAL | Status: DC | PRN

## 2022-09-18 MED ORDER — AMLODIPINE BESYLATE 5 MG PO TABS
10.0000 mg | ORAL_TABLET | Freq: Every day | ORAL | Status: DC
  Administered 2022-09-19 – 2022-09-25 (×7): 10 mg via ORAL
  Filled 2022-09-18 (×7): qty 2

## 2022-09-18 NOTE — ED Notes (Signed)
Patient ambulated with pulse ox of no greater than 85% on RA. Patient back in bed and on 2L . EDP aware.

## 2022-09-18 NOTE — Discharge Instructions (Addendum)
Advised to follow-up with primary care physician in 1 week. Patient has completed antibiotic treatment for COPD exacerbation. Advised to  take prednisone 30 mg for 2 days then decrease to prednisone 20 mg for 2 days then decrease to prednisone 10 mg for 1 day then discontinue. Advised to follow-up with pulmonology as scheduled.  Appointment has been made.

## 2022-09-18 NOTE — ED Triage Notes (Addendum)
Pt brought in by [facility] for c/o sob; pt has hx of copd; pt c/o sob x 2 weeks and worse with exertion  Pt was seen at his PCP a month ago and was given inhaler and prednisone with not a significant relief in symptoms  Pt was given albuterol and solumedrol '125mg'$  IV en route by ems  Pt has inspiratory wheezes to left side  Pt has 18G L AC

## 2022-09-18 NOTE — ED Notes (Signed)
Hospitalist at bedside 

## 2022-09-18 NOTE — H&P (Signed)
TRH H&P   Patient Demographics:    Christopher Conley, is a 67 y.o. male  MRN: [id number]   DOB - 04/06/1956  Admit Date - 09/18/2022  Outpatient Primary MD for the patient is Neale Burly, MD  Referring MD/NP/PA: Dr Melina Copa  Patient coming from: Home  Chief Complaint  Patient presents with   Shortness of Breath      HPI:    Christopher Conley  is a 67 y.o. male, with past medical history of COPD, tobacco abuse, CAD, hypertension, and hyperlipidemia, patient presents to ED secondary to complaints of shortness of breath, cough, productive phlegm, reports symptoms as progressing over the last 2 weeks, him inhalers did not help, as well reports worsening wheezing, which prompted him to come to ED, he denies any fever or chills, no nausea, no vomiting, no diarrhea. -Patient was with significant wheezing and dyspnea, so he received 125 mg of IV Solu-Medrol by EMS, chest x-ray with no acute findings, prater panel was negative, he was saturating 85% on room air, this has improved with 2 L oxygen, [facility] hospitalist consulted to admit.    Review of systems:      A full 10 point Review of Systems was done, except as stated above, all other Review of Systems were negative.   With Past History of the following :    Past Medical History:  Diagnosis Date   Arthritis    Bipolar disorder ([place])    COPD (chronic obstructive pulmonary disease) ([place])    Coronary artery disease    Hypertension    Myocardial infarction [facility])       Past Surgical History:  Procedure Laterality Date   ANTERIOR LAT LUMBAR FUSION  2008   APPENDECTOMY     CARDIAC CATHETERIZATION     COLONOSCOPY WITH PROPOFOL N/A 04/18/2016   Procedure: COLONOSCOPY WITH PROPOFOL;  Surgeon: Rogene Houston, MD;  [facility];  Service: Endoscopy;  Laterality: N/A;  [place]     Umbilical    POLYPECTOMY  04/18/2016   Procedure: POLYPECTOMY;  Surgeon: Rogene Houston, MD;  Location: [facility];  Service: Endoscopy;;  hepatic flexure, splenic flexuere,and sigmoid polypectomies      Social History:     Social History   Tobacco Use   Smoking status: Every Day    Packs/day: 1.00    Years: 50.00    Total pack years: 50.00    Types: Cigarettes    Start date: 1970   Smokeless tobacco: Never  Substance Use Topics   Alcohol use: No        Family History :     Family History  Problem Relation Age of Onset   CAD Brother    COPD Brother       Home Medications:   Prior to Admission medications   Medication  Sig Start Date End Date Taking? Authorizing Provider  azithromycin (ZITHROMAX) 250 MG tablet Take 1 tablet (250 mg total) by mouth daily. Take first 2 tablets together, then 1 every day until finished. 09/18/22  Yes Hayden Rasmussen, MD  predniSONE (DELTASONE) 50 MG tablet Take 1 tablet (50 mg total) by mouth daily. 09/18/22  Yes Hayden Rasmussen, MD  acetaminophen (TYLENOL) 500 MG tablet Take 1,000 mg by mouth every 6 (six) hours as needed for mild pain.    [provider]  amLODipine (NORVASC) 10 MG tablet Take 10 mg by mouth daily.    [provider]  citalopram (CELEXA) 20 MG tablet Take 1 tablet (20 mg total) by mouth daily. 01/16/21   Spero Geralds, MD  EFFIENT 10 MG TABS tablet Take 1 tablet (10 mg total) by mouth daily. 04/19/16   Rehman, Mechele Dawley, MD  ipratropium-albuterol (DUONEB) 0.5-2.5 (3) MG/3ML SOLN INHALE CONTENTS OF 1 VIAL IN NEBULIZER FOUR TIMES DAILY 05/08/21   [provider]  metoprolol (LOPRESSOR) 50 MG tablet Take 25 mg by mouth 2 (two) times daily. 01/17/16   [provider]  polyethylene glycol-electrolytes (TRILYTE) 420 g solution Take 4,000 mLs by mouth as directed. 05/15/22   Harvel Quale, MD  simvastatin (ZOCOR) 40 MG tablet Take 40 mg by mouth daily. 03/08/16   [provider]  tamsulosin  (FLOMAX) 0.4 MG CAPS capsule Take 0.4 mg by mouth daily. 03/24/16   [provider]  VENTOLIN HFA 108 (90 Base) MCG/ACT inhaler Inhale 2 puffs into the lungs every 6 (six) hours as needed for wheezing or shortness of breath. as directed 01/16/21   Spero Geralds, MD     Allergies:    No Known Allergies   Physical Exam:   Vitals  Blood pressure (!) 158/66, pulse 76, temperature 98.7 F (37.1 C), temperature source Oral, resp. rate (!) 22, height '5\' 8"'$  (1.727 m), weight 101.6 kg, SpO2 (!) 89 %.   1. General well developed male  lying in bed in NAD  2. Normal affect and insight, Not Suicidal or Homicidal, Awake Alert, Oriented X 3.  3. No F.N deficits, ALL C.Nerves Intact, Strength 5/5 all 4 extremities, Sensation intact all 4 extremities, Plantars down going.  4. Ears and Eyes appear Normal, Conjunctivae clear, PERRLA. Moist Oral Mucosa.  5. Supple Neck, No JVD, No cervical lymphadenopathy appriciated, No Carotid Bruits.  6. Symmetrical Chest wall movement, 3 with diffuse wheezing bilaterally  7. RRR, No Gallops, Rubs or Murmurs, No Parasternal Heave.  8. Positive Bowel Sounds, Abdomen Soft, No tenderness, No organomegaly appriciated,No rebound -guarding or rigidity.  9.  No Cyanosis, Normal Skin Turgor, No Skin Rash or Bruise.  10. Good muscle tone,  joints appear normal , no effusions, Normal ROM.    Data Review:    CBC Recent Labs  Lab 09/18/22 1445  WBC 9.9  HGB 16.0  HCT 49.8  PLT 243  MCV 94.1  MCH 30.2  MCHC 32.1  RDW 13.4  LYMPHSABS 1.8  MONOABS 0.5  EOSABS 0.5  BASOSABS 0.1   ------------------------------------------------------------------------------------------------------------------  Chemistries  Recent Labs  Lab 09/18/22 1445  NA 138  K 4.6  CL 104  CO2 25  GLUCOSE 94  BUN 18  CREATININE 0.81  CALCIUM 8.8*    ------------------------------------------------------------------------------------------------------------------ estimated creatinine clearance is 102.3 mL/min (by C-G formula based on SCr of 0.81 mg/dL). ------------------------------------------------------------------------------------------------------------------ No results for input(s): "TSH", "T4TOTAL", "T3FREE", "THYROIDAB" in the last 72 hours.  Invalid  input(s): "FREET3"  Coagulation profile No results for input(s): "INR", "PROTIME" in the last 168 hours. ------------------------------------------------------------------------------------------------------------------- No results for input(s): "DDIMER" in the last 72 hours. -------------------------------------------------------------------------------------------------------------------  Cardiac Enzymes No results for input(s): "CKMB", "TROPONINI", "MYOGLOBIN" in the last 168 hours.  Invalid input(s): "CK" ------------------------------------------------------------------------------------------------------------------    Component Value Date/Time   BNP 14.0 09/18/2022 1445     ---------------------------------------------------------------------------------------------------------------  Urinalysis No results found for: "COLORURINE", "APPEARANCEUR", "LABSPEC", "PHURINE", "GLUCOSEU", "HGBUR", "BILIRUBINUR", "KETONESUR", "PROTEINUR", "UROBILINOGEN", "NITRITE", "LEUKOCYTESUR"  ----------------------------------------------------------------------------------------------------------------   Imaging Results:    DG Chest 2 View  Result Date: 09/18/2022 CLINICAL DATA:  Shortness of breath. EXAM: CHEST - 2 VIEW COMPARISON:  06/11/2009 FINDINGS: The lungs are clear without focal pneumonia, edema, pneumothorax or pleural effusion. Stable nodule peripheral right lower lung, likely calcified granuloma. The cardiopericardial silhouette is within normal limits for size. Streaky  opacity at the left base is compatible with atelectasis or scarring. The visualized bony structures of the thorax are unremarkable. IMPRESSION: No active cardiopulmonary disease. Electronically Signed   By: Misty Stanley M.D.   On: 09/18/2022 15:36    EKG PR interval 180 ms QRS duration 90 ms QT/QTcB 400/428 ms P-R-T axes 79 -77 51 Sinus rhythm with occasional Premature ventricular complexes Left axis deviation Anteroseptal infarct (cited on or before 15-Apr-2016) Abnormal ECG   Assessment and plan    Principal Problem:   COPD exacerbation (HCC) Active Problems:   CAD (coronary artery disease)   Tobacco abuse   Essential hypertension   Acute hypoxic respiratory failure COPD exacerbation -With hypoxic respiratory failure saturating 85% on room air, currently requiring 2 L nasal cannula -Significant wheezing, he will be started on IV Solu-Medrol, scheduled DuoNebs and as needed albuterol-was encouraged to use incentive spirometry -Reports cough with productive phlegm, will start on Rocephin -Productive panel was negative   History of CAD -Continue with Effient, metoprolol and statin -He denies any chest pain  Hypertension -Blood pressure mildly elevated, continue with home medications  Tobacco abuse -He was counseled, report he decided to quit, he was congratulated about that, will keep on nicotine patch during hospital stay  Hyperlipidemia -Continue with home dose statin  DVT Prophylaxis   Lovenox   AM Labs Ordered, also please review Full Orders  Family Communication: Admission, patients condition and plan of care including tests being ordered have been discussed with the patient who indicate understanding and agree with the plan and Code Status.  Code Status full  Likely DC to  home  Condition GUARDED    Consults called: none    Admission status: inpatient    Time spent in minutes : 60 minutes   Phillips Climes M.D on 09/18/2022 at 6:30 PM   [facility] - Office  [phone]

## 2022-09-18 NOTE — ED Provider Notes (Signed)
[facility] EMERGENCY DEPARTMENT Provider Note   CSN: [id number] Arrival date & time: 09/18/22  1357     History  Chief Complaint  Patient presents with   Shortness of Breath    Christopher Conley is a 67 y.o. male.  He has a history of COPD continues to smoke.  Complaining of 2 weeks of increased shortness of breath dyspnea on exertion wheezing tightness in his throat.  He says it feels like his air just.  He has had a cough productive of some white sputum.  Not sure of fever.  No abdominal pain vomiting diarrhea.  He received Solu-Medrol and DuoNeb by EMS.  The history is provided by the patient.  Shortness of Breath Severity:  Moderate Onset quality:  Gradual Duration:  2 weeks Timing:  Intermittent Progression:  Worsening Chronicity:  Recurrent Relieved by:  Nothing Worsened by:  Activity and coughing Ineffective treatments:  Rest and inhaler Associated symptoms: cough, sputum production and wheezing   Associated symptoms: no abdominal pain, no chest pain, no fever, no rash, no sore throat and no vomiting   Risk factors: tobacco use        Home Medications Prior to Admission medications   Medication Sig Start Date End Date Taking? Authorizing Provider  acetaminophen (TYLENOL) 500 MG tablet Take 1,000 mg by mouth every 6 (six) hours as needed for mild pain.    [provider]  amLODipine (NORVASC) 10 MG tablet Take 10 mg by mouth daily.    [provider]  citalopram (CELEXA) 20 MG tablet Take 1 tablet (20 mg total) by mouth daily. 01/16/21   Spero Geralds, MD  EFFIENT 10 MG TABS tablet Take 1 tablet (10 mg total) by mouth daily. 04/19/16   Rehman, Mechele Dawley, MD  ipratropium-albuterol (DUONEB) 0.5-2.5 (3) MG/3ML SOLN INHALE CONTENTS OF 1 VIAL IN NEBULIZER FOUR TIMES DAILY 05/08/21   [provider]  metoprolol (LOPRESSOR) 50 MG tablet Take 25 mg by mouth 2 (two) times daily. 01/17/16   [provider]  polyethylene glycol-electrolytes (TRILYTE)  420 g solution Take 4,000 mLs by mouth as directed. 05/15/22   Harvel Quale, MD  simvastatin (ZOCOR) 40 MG tablet Take 40 mg by mouth daily. 03/08/16   [provider]  tamsulosin (FLOMAX) 0.4 MG CAPS capsule Take 0.4 mg by mouth daily. 03/24/16   [provider]  VENTOLIN HFA 108 (90 Base) MCG/ACT inhaler Inhale 2 puffs into the lungs every 6 (six) hours as needed for wheezing or shortness of breath. as directed 01/16/21   Spero Geralds, MD      Allergies    Patient has no known allergies.    Review of Systems   Review of Systems  Constitutional:  Negative for fever.  HENT:  Negative for sore throat.   Respiratory:  Positive for cough, sputum production, shortness of breath and wheezing.   Cardiovascular:  Negative for chest pain.  Gastrointestinal:  Negative for abdominal pain and vomiting.  Genitourinary:  Negative for dysuria.  Skin:  Negative for rash.    Physical Exam Updated Vital Signs BP (!) 149/90 (BP Location: Right Arm)   Pulse 70   Temp 98.7 F (37.1 C) (Oral)   Resp 20   Ht '5\' 8"'$  (1.727 m)   Wt 101.6 kg   SpO2 96%   BMI 34.06 kg/m  Physical Exam Vitals and nursing note reviewed.  Constitutional:      General: He is not in acute distress.  Appearance: He is well-developed.  HENT:     Head: Normocephalic and atraumatic.  Eyes:     Conjunctiva/sclera: Conjunctivae normal.  Cardiovascular:     Rate and Rhythm: Normal rate and regular rhythm.     Heart sounds: No murmur heard. Pulmonary:     Effort: Accessory muscle usage present. No respiratory distress.     Breath sounds: Wheezing and rhonchi present.  Abdominal:     Palpations: Abdomen is soft.     Tenderness: There is no abdominal tenderness.  Musculoskeletal:        General: No swelling.     Cervical back: Neck supple.     Right lower leg: No tenderness. No edema.     Left lower leg: No tenderness. No edema.  Skin:    General: Skin is warm and dry.     Capillary  Refill: Capillary refill takes less than 2 seconds.  Neurological:     General: No focal deficit present.     Mental Status: He is alert.     ED Results / Procedures / Treatments   Labs (all labs ordered are listed, but only abnormal results are displayed) Labs Reviewed  BASIC METABOLIC PANEL - Abnormal; Notable for the following components:      Result Value   Calcium 8.8 (*)    All other components within normal limits  BLOOD GAS, VENOUS - Abnormal; Notable for the following components:   pO2, Ven 58 (*)    Bicarbonate 29.2 (*)    Acid-Base Excess 4.2 (*)    All other components within normal limits  BASIC METABOLIC PANEL - Abnormal; Notable for the following components:   Glucose, Bld 136 (*)    Calcium 8.5 (*)    All other components within normal limits  CBC - Abnormal; Notable for the following components:   WBC 12.7 (*)    All other components within normal limits  RESP PANEL BY RT-PCR (RSV, FLU A&B, COVID)  RVPGX2  BRAIN NATRIURETIC PEPTIDE  CBC WITH DIFFERENTIAL/PLATELET  HIV ANTIBODY (ROUTINE TESTING W REFLEX)  TROPONIN I (HIGH SENSITIVITY)    EKG EKG Interpretation  Date/Time:  Thursday September 18 2022 14:15:02 EST Ventricular Rate:  69 PR Interval:  180 QRS Duration: 90 QT Interval:  400 QTC Calculation: 428 R Axis:   -77 Text Interpretation: Sinus rhythm with occasional Premature ventricular complexes Left axis deviation Anteroseptal infarct (cited on or before 15-Apr-2016) Abnormal ECG When compared with ECG of 08-May-2022 09:47, No significant change was found Confirmed by Aletta Edouard [phone]) on 09/18/2022 3:08:40 PM  Radiology DG Chest 2 View  Result Date: 09/18/2022 CLINICAL DATA:  Shortness of breath. EXAM: CHEST - 2 VIEW COMPARISON:  06/11/2009 FINDINGS: The lungs are clear without focal pneumonia, edema, pneumothorax or pleural effusion. Stable nodule peripheral right lower lung, likely calcified granuloma. The cardiopericardial silhouette is  within normal limits for size. Streaky opacity at the left base is compatible with atelectasis or scarring. The visualized bony structures of the thorax are unremarkable. IMPRESSION: No active cardiopulmonary disease. Electronically Signed   By: Misty Stanley M.D.   On: 09/18/2022 15:36    Procedures .Critical Care  Performed by: Hayden Rasmussen, MD Authorized by: Hayden Rasmussen, MD   Critical care provider statement:    Critical care time (minutes):  45   Critical care time was exclusive of:  Separately billable procedures and treating other patients   Critical care was necessary to treat or prevent imminent or life-threatening deterioration of  the following conditions:  Respiratory failure   Critical care was time spent personally by me on the following activities:  Development of treatment plan with patient or surrogate, discussions with consultants, evaluation of patient's response to treatment, examination of patient, obtaining history from patient or surrogate, ordering and performing treatments and interventions, ordering and review of laboratory studies, ordering and review of radiographic studies, pulse oximetry, re-evaluation of patient's condition and review of old charts   I assumed direction of critical care for this patient from another provider in my specialty: no       Medications Ordered in ED Medications  albuterol (PROVENTIL,VENTOLIN) solution continuous neb (10 mg/hr Nebulization Not Given 09/18/22 1543)  cefTRIAXone (ROCEPHIN) 1 g in sodium chloride 0.9 % 100 mL IVPB (1 g Intravenous New Bag/Given 09/18/22 2012)  methylPREDNISolone sodium succinate (SOLU-MEDROL) 125 mg/2 mL injection 80 mg (80 mg Intravenous Given 09/19/22 0910)    Followed by  predniSONE (DELTASONE) tablet 40 mg (has no administration in time range)  ipratropium-albuterol (DUONEB) 0.5-2.5 (3) MG/3ML nebulizer solution 3 mL (3 mLs Nebulization Given 09/19/22 0827)  albuterol (PROVENTIL) (2.5 MG/3ML) 0.083%  nebulizer solution 2.5 mg (2.5 mg Nebulization Given 09/19/22 0832)  enoxaparin (LOVENOX) injection 40 mg (40 mg Subcutaneous Given 09/18/22 2106)  acetaminophen (TYLENOL) tablet 650 mg (has no administration in time range)    Or  acetaminophen (TYLENOL) suppository 650 mg (has no administration in time range)  ondansetron (ZOFRAN) tablet 4 mg (has no administration in time range)    Or  ondansetron (ZOFRAN) injection 4 mg (has no administration in time range)  traZODone (DESYREL) tablet 25 mg (has no administration in time range)  nicotine (NICODERM CQ - dosed in mg/24 hours) patch 21 mg (21 mg Transdermal Patch Applied 09/19/22 0911)  pantoprazole (PROTONIX) EC tablet 40 mg (40 mg Oral Given 09/19/22 0910)  amLODipine (NORVASC) tablet 10 mg (10 mg Oral Given 09/19/22 0909)  citalopram (CELEXA) tablet 20 mg (has no administration in time range)  prasugrel (EFFIENT) tablet 10 mg (has no administration in time range)  metoprolol tartrate (LOPRESSOR) tablet 25 mg (has no administration in time range)  tamsulosin (FLOMAX) capsule 0.4 mg (0.4 mg Oral Given 09/19/22 0912)  atorvastatin (LIPITOR) tablet 20 mg (20 mg Oral Given 09/19/22 0910)  magnesium sulfate IVPB 2 g 50 mL (0 g Intravenous Stopped 09/18/22 1720)  albuterol (PROVENTIL) (2.5 MG/3ML) 0.083% nebulizer solution (10 mg  Given 09/18/22 1540)  metoprolol tartrate (LOPRESSOR) tablet 25 mg (25 mg Oral Given 09/18/22 2349)    ED Course/ Medical Decision Making/ A&P Clinical Course as of 09/19/22 0929  Thu Sep 18, 2022  1539 Chest x-ray interpreted by me as no acute infiltrates.  Awaiting radiology reading. [MB]  1741 Workup has been fairly unrevealing.  [place] for me though that the patient is now requiring oxygen.  Will get a trending pulse ox to see if appropriate for discharge. [MB]  2633 Nurse did a trending pulse ox and impunity dropped to 85% ambulating.  Will need admission for further management of COPD exacerbation. [MB]  1800 Discussed with [facility]  hospitalist Dr. [facility] who will evaluate patient for admission. [MB]    Clinical Course User Index [MB] Hayden Rasmussen, MD                           Medical Decision Making Risk Prescription drug management. Decision regarding hospitalization.   This patient complains of cough shortness of breath; this  involves an extensive number of treatment Options and is a complaint that carries with it a high risk of complications and morbidity. The differential includes OPD, PE, pneumothorax, vascular, pneumonia, COVID, flu  I ordered, reviewed and interpreted labs, which included CBC with elevated white count normal hemoglobin, chemistries normal other than elevated glucose, VBG without significant acidosis, troponins flat BNP normal COVID and flu negative I ordered medication IV magnesium and steroids, oral breathing treatments and reviewed PMP when indicated. I ordered imaging studies which included chest x-ray and I independently    visualized and interpreted imaging which showed no definite infiltrate Additional history obtained from patient's companion Previous records obtained and reviewed in epic, no recent admissions I consulted Dr. [facility]ist and discussed lab and imaging findings and discussed disposition.  Cardiac monitoring reviewed, normal sinus rhythm Social determinants considered, ongoing tobacco use Critical Interventions: Initiation of oxygen steroids breathing treatments for hypoxic respiratory failure  After the interventions stated above, I reevaluated the patient and found patient to be more comfortable in no distress Admission and further testing considered, he still has an oxygen requirement so will need to be admitted to the hospital for further management.  Patient in agreement with plan for admission.         Final Clinical Impression(s) / ED Diagnoses Final diagnoses:  COPD exacerbation ([place])  Acute respiratory failure with hypoxia  [facility])    Rx / DC Orders ED Discharge Orders     None         Hayden Rasmussen, MD 09/19/22 [phone]

## 2022-09-19 DIAGNOSIS — J441 Chronic obstructive pulmonary disease with (acute) exacerbation: Secondary | ICD-10-CM | POA: Diagnosis not present

## 2022-09-19 LAB — CBC
HCT: 47.7 % (ref 39.0–52.0)
Hemoglobin: 15.5 g/dL (ref 13.0–17.0)
MCH: 30.3 pg (ref 26.0–34.0)
MCHC: 32.5 g/dL (ref 30.0–36.0)
MCV: 93.2 fL (ref 80.0–100.0)
Platelets: 272 10*3/uL (ref 150–400)
RBC: 5.12 MIL/uL (ref 4.22–5.81)
RDW: 13.4 % (ref 11.5–15.5)
WBC: 12.7 10*3/uL — ABNORMAL HIGH (ref 4.0–10.5)
nRBC: 0 % (ref 0.0–0.2)

## 2022-09-19 LAB — BASIC METABOLIC PANEL
Anion gap: 9 (ref 5–15)
BUN: 23 mg/dL (ref 8–23)
CO2: 24 mmol/L (ref 22–32)
Calcium: 8.5 mg/dL — ABNORMAL LOW (ref 8.9–10.3)
Chloride: 103 mmol/L (ref 98–111)
Creatinine, Ser: 0.81 mg/dL (ref 0.61–1.24)
GFR, Estimated: >60 mL/min (ref >60)
Glucose, Bld: 136 mg/dL — ABNORMAL HIGH (ref 70–99)
Potassium: 4.2 mmol/L (ref 3.5–5.1)
Sodium: 136 mmol/L (ref 135–145)

## 2022-09-19 LAB — HIV ANTIBODY (ROUTINE TESTING W REFLEX): HIV Screen 4th Generation wRfx: NONREACTIVE

## 2022-09-19 NOTE — Care Management Important Message (Signed)
Important Message  Patient Details  Name: Christopher Conley MRN: [id number] Date of Birth: 1956-03-19   Medicare Important Message Given:  Yes     Tommy Medal 09/19/2022, 11:46 AM

## 2022-09-19 NOTE — Progress Notes (Signed)
  Transition of Care [facility]) Screening Note   Patient Details  Name: Christopher Conley Date of Birth: 11-21-1955   Transition of Care [facility]) CM/SW Contact:    Iona Beard, [place] Phone Number: 09/19/2022, 11:15 AM    Transition of Care Department [facility]) has reviewed patient and no TOC needs have been identified at this time. We will continue to monitor patient advancement through interdisciplinary progression rounds. If new patient transition needs arise, please place a TOC consult.

## 2022-09-19 NOTE — Progress Notes (Signed)
PROGRESS NOTE    Christopher Conley  [id number]:[id number] DOB: [birth year] DOA: 09/18/2022  PCP: Neale Burly, MD   Brief Narrative:  This 67 years old male with PMH significant for COPD, ongoing tobacco abuse, CAD, hypertension and hyperlipidemia presented in the ED with complaints of shortness of breath, productive cough for last 2 weeks. He has been using his regular inhalers without any help,  also has worsening wheezing which prompted him to come to the ED for further evaluation.  Patient is admitted for acute hypoxic respiratory failure secondary to COPD exacerbation requiring Solu-Medrol and nebulized bronchodilators.  Assessment & Plan:   Principal Problem:   COPD exacerbation ([place]) Active Problems:   CAD (coronary artery disease)   Tobacco abuse   Essential hypertension  Acute hypoxic respiratory failure: COPD exacerbation: Patient presented with shortness of breath, productive cough, chest congestion and found to have SpO2 of 85% on room air requiring 5 L of supplemental oxygen. He also has significant wheezing noted on exam and started on IV Solu-Medrol and nebulized bronchodilators. Continue IV Solu-Medrol 80 mg every 12 hours,  Continue scheduled and as needed nebulized bronchodilators. Continue antitussives. Started on IV Rocephin.  Respiratory panel negative. Continue supplemental oxygen and wean as tolerated.  History of CAD: Continue Effient, metoprolol and statins. He denies any chest pain.  Essential hypertension: Continue metoprolol 25 mg daily. Continue amlodipine 10 mg daily.  Hyperlipidemia: Continue Lipitor 20 mg daily  Tobacco use: Patient was counseled about quit smoking.   He is agreeable and started on nicotine patch  Obesity: Diet and exercise discussed in detail. Estimated body mass index is 34.06 kg/m as calculated from the following:   Height as of this encounter: '5\' 8"'$  (1.727 m).   Weight as of this encounter: 101.6 kg.    DVT prophylaxis:  Lovenox Code Status: Full code Family Communication: No family at bedside Disposition Plan:   Status is: Inpatient Remains inpatient appropriate because: Admitted for acute hypoxic respiratory failure secondary to COPD exacerbation,  requiring IV Solu-Medrol and supplemental oxygen.   Consultants:  None  Procedures: None  Antimicrobials: Ceftriaxone  Subjective: Patient was seen and examined at bedside.  Overnight events noted. He still feels tight,  still has significant wheezing on exam.   He remains on 5 L of supplemental oxygen.  Objective: Vitals:   09/19/22 0200 09/19/22 0220 09/19/22 0800 09/19/22 0833  BP: 124/76 (!) 154/72 133/69   Pulse: 67 71 74   Resp: (!) '22 20 19   '$ Temp:  97.8 F (36.6 C) 98.1 F (36.7 C)   TempSrc:  Oral Oral   SpO2: 93% 94% 91% 90%  Weight:      Height:        Intake/Output Summary (Last 24 hours) at 09/19/2022 1121 Last data filed at 09/19/2022 0600 Gross per 24 hour  Intake 95.96 ml  Output --  Net 95.96 ml   Filed Weights   09/18/22 1419  Weight: 101.6 kg    Examination:  General exam: Appears comfortable, not in any acute distress.  Deconditioned Respiratory system: Wheezing bilaterally, respiratory effort normal, no accessory muscle use, RR 13 Cardiovascular system: S1 & S2 heard, regular rate and rhythm, no murmur. Gastrointestinal system: Abdomen is soft, non tender, non distended, BS+ Central nervous system: Alert and oriented X 3. No focal neurological deficits. Extremities: No edema, no cyanosis, no clubbing Skin: No rashes, lesions or ulcers Psychiatry: Judgement and insight appear normal. Mood & affect appropriate.     Data  Reviewed: I have personally reviewed following labs and imaging studies  CBC: Recent Labs  Lab 09/18/22 1445 09/19/22 0446  WBC 9.9 12.7*  NEUTROABS 7.0  --   HGB 16.0 15.5  HCT 49.8 47.7  MCV 94.1 93.2  PLT 243 785   Basic Metabolic Panel: Recent Labs  Lab 09/18/22 1445  09/19/22 0446  NA 138 136  K 4.6 4.2  CL 104 103  CO2 25 24  GLUCOSE 94 136*  BUN 18 23  CREATININE 0.81 0.81  CALCIUM 8.8* 8.5*   GFR: Estimated Creatinine Clearance: 102.3 mL/min (by C-G formula based on SCr of 0.81 mg/dL). Liver Function Tests: No results for input(s): "AST", "ALT", "ALKPHOS", "BILITOT", "PROT", "ALBUMIN" in the last 168 hours. No results for input(s): "LIPASE", "AMYLASE" in the last 168 hours. No results for input(s): "AMMONIA" in the last 168 hours. Coagulation Profile: No results for input(s): "INR", "PROTIME" in the last 168 hours. Cardiac Enzymes: No results for input(s): "CKTOTAL", "CKMB", "CKMBINDEX", "TROPONINI" in the last 168 hours. BNP (last 3 results) No results for input(s): "PROBNP" in the last 8760 hours. HbA1C: No results for input(s): "HGBA1C" in the last 72 hours. CBG: No results for input(s): "GLUCAP" in the last 168 hours. Lipid Profile: No results for input(s): "CHOL", "HDL", "LDLCALC", "TRIG", "CHOLHDL", "LDLDIRECT" in the last 72 hours. Thyroid Function Tests: No results for input(s): "TSH", "T4TOTAL", "FREET4", "T3FREE", "THYROIDAB" in the last 72 hours. Anemia Panel: No results for input(s): "VITAMINB12", "FOLATE", "FERRITIN", "TIBC", "IRON", "RETICCTPCT" in the last 72 hours. Sepsis Labs: No results for input(s): "PROCALCITON", "LATICACIDVEN" in the last 168 hours.  Recent Results (from the past 240 hour(s))  Resp panel by RT-PCR (RSV, Flu A&B, Covid) Anterior Nasal Swab     Status: None   Collection Time: 09/18/22  2:55 PM   Specimen: Anterior Nasal Swab  Result Value Ref Range Status   SARS Coronavirus 2 by RT PCR NEGATIVE NEGATIVE Final    Comment: (NOTE) SARS-CoV-2 target nucleic acids are NOT DETECTED.  The SARS-CoV-2 RNA is generally detectable in upper respiratory specimens during the acute phase of infection. The lowest concentration of SARS-CoV-2 viral copies this assay can detect is 138 copies/mL. A negative  result does not preclude SARS-Cov-2 infection and should not be used as the sole basis for treatment or other patient management decisions. A negative result may occur with  improper specimen collection/handling, submission of specimen other than nasopharyngeal swab, presence of viral mutation(s) within the areas targeted by this assay, and inadequate number of viral copies(<138 copies/mL). A negative result must be combined with clinical observations, patient history, and epidemiological information. The expected result is Negative.  Fact Sheet for Patients:  EntrepreneurPulse.com.au  Fact Sheet for Healthcare Providers:  IncredibleEmployment.be  This test is no t yet approved or cleared by the [place] FDA and  has been authorized for detection and/or diagnosis of SARS-CoV-2 by FDA under an Emergency Use Authorization (EUA). This EUA will remain  in effect (meaning this test can be used) for the duration of the COVID-19 declaration under Section 564(b)(1) of the Act, 21 U.S.C.section 360bbb-3(b)(1), unless the authorization is terminated  or revoked sooner.       Influenza A by PCR NEGATIVE NEGATIVE Final   Influenza B by PCR NEGATIVE NEGATIVE Final    Comment: (NOTE) The Xpert Xpress SARS-CoV-2/FLU/RSV plus assay is intended as an aid in the diagnosis of influenza from Nasopharyngeal swab specimens and should not be used as a sole basis for treatment.  Nasal washings and aspirates are unacceptable for Xpert Xpress SARS-CoV-2/FLU/RSV testing.  Fact Sheet for Patients: EntrepreneurPulse.com.au  Fact Sheet for Healthcare Providers: IncredibleEmployment.be  This test is not yet approved or cleared by the [place] FDA and has been authorized for detection and/or diagnosis of SARS-CoV-2 by FDA under an Emergency Use Authorization (EUA). This EUA will remain in effect (meaning this test can be used)  for the duration of the COVID-19 declaration under Section 564(b)(1) of the Act, 21 U.S.C. section 360bbb-3(b)(1), unless the authorization is terminated or revoked.     Resp Syncytial Virus by PCR NEGATIVE NEGATIVE Final    Comment: (NOTE) Fact Sheet for Patients: EntrepreneurPulse.com.au  Fact Sheet for Healthcare Providers: IncredibleEmployment.be  This test is not yet approved or cleared by the [place] FDA and has been authorized for detection and/or diagnosis of SARS-CoV-2 by FDA under an Emergency Use Authorization (EUA). This EUA will remain in effect (meaning this test can be used) for the duration of the COVID-19 declaration under Section 564(b)(1) of the Act, 21 U.S.C. section 360bbb-3(b)(1), unless the authorization is terminated or revoked.  Performed at [facility], [address] [place], [place] [address]     Radiology Studies: DG Chest 2 View  Result Date: 09/18/2022 CLINICAL DATA:  Shortness of breath. EXAM: CHEST - 2 VIEW COMPARISON:  06/11/2009 FINDINGS: The lungs are clear without focal pneumonia, edema, pneumothorax or pleural effusion. Stable nodule peripheral right lower lung, likely calcified granuloma. The cardiopericardial silhouette is within normal limits for size. Streaky opacity at the left base is compatible with atelectasis or scarring. The visualized bony structures of the thorax are unremarkable. IMPRESSION: No active cardiopulmonary disease. Electronically Signed   By: Misty Stanley M.D.   On: 09/18/2022 15:36    Scheduled Meds:  amLODipine  10 mg Oral Daily   atorvastatin  20 mg Oral Daily   enoxaparin (LOVENOX) injection  40 mg Subcutaneous Q24H   ipratropium-albuterol  3 mL Nebulization Q6H   metoprolol tartrate  25 mg Oral BID   nicotine  21 mg Transdermal Daily   pantoprazole  40 mg Oral Daily   prasugrel  10 mg Oral Daily   [START ON 09/20/2022] predniSONE  40 mg Oral Q breakfast   tamsulosin  0.4  mg Oral Daily   Continuous Infusions:  albuterol     cefTRIAXone (ROCEPHIN)  IV 1 g (09/18/22 2012)     LOS: 1 day    Time spent: 50 mins    Lysandra Loughmiller, MD [facility]   If 7PM-7AM, please contact night-coverage

## 2022-09-20 DIAGNOSIS — J441 Chronic obstructive pulmonary disease with (acute) exacerbation: Secondary | ICD-10-CM | POA: Diagnosis not present

## 2022-09-20 LAB — PROCALCITONIN: Procalcitonin: <0.1 ng/mL

## 2022-09-20 MED ORDER — GUAIFENESIN 100 MG/5ML PO LIQD
5.0000 mL | ORAL | Status: DC | PRN
  Administered 2022-09-20 – 2022-09-21 (×2): 5 mL via ORAL
  Filled 2022-09-20 (×2): qty 5

## 2022-09-20 MED ORDER — HYDRALAZINE HCL 25 MG PO TABS
25.0000 mg | ORAL_TABLET | Freq: Three times a day (TID) | ORAL | Status: DC
  Administered 2022-09-20 – 2022-09-22 (×6): 25 mg via ORAL
  Filled 2022-09-20 (×6): qty 1

## 2022-09-20 MED ORDER — METHYLPREDNISOLONE SODIUM SUCC 125 MG IJ SOLR
60.0000 mg | Freq: Two times a day (BID) | INTRAMUSCULAR | Status: DC
  Administered 2022-09-20 – 2022-09-23 (×7): 60 mg via INTRAVENOUS
  Filled 2022-09-20 (×7): qty 2

## 2022-09-20 NOTE — Progress Notes (Signed)
Patient resting quietly through most of the night, requested nebulizer treatment at 12am and returned to sleep after treatment rendered. Resting with eyes closed and call bell in reach

## 2022-09-20 NOTE — Progress Notes (Signed)
PROGRESS NOTE    Christopher Conley  RXV:[id number] DOB: Mar 16, 1956 DOA: 09/18/2022  PCP: Neale Burly, MD   Brief Narrative:  This 67 years old male with PMH significant for COPD, ongoing tobacco abuse, CAD, hypertension and hyperlipidemia presented in the ED with complaints of shortness of breath, productive cough for last 2 weeks. He has been using his regular inhalers without any help,  also has worsening wheezing which prompted him to come to the ED for further evaluation.  Patient is admitted for acute hypoxic respiratory failure secondary to COPD exacerbation requiring Solu-Medrol and nebulized bronchodilators.  Assessment & Plan:   Principal Problem:   COPD exacerbation ([place]) Active Problems:   CAD (coronary artery disease)   Tobacco abuse   Essential hypertension  Acute hypoxic respiratory failure: COPD exacerbation: Patient presented with shortness of breath, productive cough, chest congestion and found to have SpO2 of 85% on room air requiring 5 L of supplemental oxygen. He also has significant wheezing noted on exam and started on IV Solu-Medrol and nebulized bronchodilators.  He is now requiring 14 L of high flow oxygen. Continue IV Solu-Medrol 60 mg every 12 hours,  Continue scheduled and as needed nebulized bronchodilators. Continue antitussives. Continue IV Rocephin.  Respiratory panel negative.  Obtain procalcitonin. Continue supplemental oxygen and wean as tolerated. Consider pulmonology consult if no improvement.  History of CAD: Continue Effient, metoprolol and statins. He denies any chest pain.  Essential hypertension: Discontinue metoprolol due to COPD. Continue amlodipine 10 mg daily. Start hydralazine 25 mg 3 times daily. Continue to monitor blood pressure.  Hyperlipidemia: Continue Lipitor 20 mg daily  Tobacco use: Patient was counseled about quit smoking.   He is agreeable and started on nicotine patch  Obesity: Diet and exercise discussed in  detail. Estimated body mass index is 34.06 kg/m as calculated from the following:   Height as of this encounter: '5\' 8"'$  (1.727 m).   Weight as of this encounter: 101.6 kg.    DVT prophylaxis: Lovenox Code Status: Full code Family Communication: No family at bedside Disposition Plan:   Status is: Inpatient Remains inpatient appropriate because: Admitted for acute hypoxic respiratory failure secondary to COPD exacerbation,  requiring IV Solu-Medrol and supplemental oxygen. Patient is now requiring 14 L of high flow oxygen.   Consultants:  None  Procedures: None  Antimicrobials: Ceftriaxone  Subjective: Patient was seen and examined at bedside.  Overnight events noted. He still feels tight,  states feeling slightly better now. He is now on 14 L of HFNC oxygen  Objective: Vitals:   09/20/22 0025 09/20/22 0032 09/20/22 0540 09/20/22 0748  BP:   (!) 132/90   Pulse:   69   Resp:   19   Temp:   97.9 F (36.6 C)   TempSrc:   Oral   SpO2: (!) 88% 91% 98% 92%  Weight:      Height:        Intake/Output Summary (Last 24 hours) at 09/20/2022 1048 Last data filed at 09/19/2022 1300 Gross per 24 hour  Intake 100 ml  Output --  Net 100 ml   Filed Weights   09/18/22 1419  Weight: 101.6 kg    Examination:  General exam: Appears comfortable, not in any acute distress.  Deconditioned. Respiratory system: Wheezing bilaterally, respiratory effort normal, no accessory muscle use, RR 15. Cardiovascular system: S1 & S2 heard, regular rate and rhythm, no murmur. Gastrointestinal system: Abdomen is soft, non tender, non distended, BS+ Central nervous system: Alert  and oriented X 3. No focal neurological deficits. Extremities: No edema, no cyanosis, no clubbing Skin: No rashes, lesions or ulcers Psychiatry: Judgement and insight appear normal. Mood & affect appropriate.     Data Reviewed: I have personally reviewed following labs and imaging studies  CBC: Recent Labs  Lab  09/18/22 1445 09/19/22 0446  WBC 9.9 12.7*  NEUTROABS 7.0  --   HGB 16.0 15.5  HCT 49.8 47.7  MCV 94.1 93.2  PLT 243 443   Basic Metabolic Panel: Recent Labs  Lab 09/18/22 1445 09/19/22 0446  NA 138 136  K 4.6 4.2  CL 104 103  CO2 25 24  GLUCOSE 94 136*  BUN 18 23  CREATININE 0.81 0.81  CALCIUM 8.8* 8.5*   GFR: Estimated Creatinine Clearance: 102.3 mL/min (by C-G formula based on SCr of 0.81 mg/dL). Liver Function Tests: No results for input(s): "AST", "ALT", "ALKPHOS", "BILITOT", "PROT", "ALBUMIN" in the last 168 hours. No results for input(s): "LIPASE", "AMYLASE" in the last 168 hours. No results for input(s): "AMMONIA" in the last 168 hours. Coagulation Profile: No results for input(s): "INR", "PROTIME" in the last 168 hours. Cardiac Enzymes: No results for input(s): "CKTOTAL", "CKMB", "CKMBINDEX", "TROPONINI" in the last 168 hours. BNP (last 3 results) No results for input(s): "PROBNP" in the last 8760 hours. HbA1C: No results for input(s): "HGBA1C" in the last 72 hours. CBG: No results for input(s): "GLUCAP" in the last 168 hours. Lipid Profile: No results for input(s): "CHOL", "HDL", "LDLCALC", "TRIG", "CHOLHDL", "LDLDIRECT" in the last 72 hours. Thyroid Function Tests: No results for input(s): "TSH", "T4TOTAL", "FREET4", "T3FREE", "THYROIDAB" in the last 72 hours. Anemia Panel: No results for input(s): "VITAMINB12", "FOLATE", "FERRITIN", "TIBC", "IRON", "RETICCTPCT" in the last 72 hours. Sepsis Labs: No results for input(s): "PROCALCITON", "LATICACIDVEN" in the last 168 hours.  Recent Results (from the past 240 hour(s))  Resp panel by RT-PCR (RSV, Flu A&B, Covid) Anterior Nasal Swab     Status: None   Collection Time: 09/18/22  2:55 PM   Specimen: Anterior Nasal Swab  Result Value Ref Range Status   SARS Coronavirus 2 by RT PCR NEGATIVE NEGATIVE Final    Comment: (NOTE) SARS-CoV-2 target nucleic acids are NOT DETECTED.  The SARS-CoV-2 RNA is generally  detectable in upper respiratory specimens during the acute phase of infection. The lowest concentration of SARS-CoV-2 viral copies this assay can detect is 138 copies/mL. A negative result does not preclude SARS-Cov-2 infection and should not be used as the sole basis for treatment or other patient management decisions. A negative result may occur with  improper specimen collection/handling, submission of specimen other than nasopharyngeal swab, presence of viral mutation(s) within the areas targeted by this assay, and inadequate number of viral copies(<138 copies/mL). A negative result must be combined with clinical observations, patient history, and epidemiological information. The expected result is Negative.  Fact Sheet for Patients:  EntrepreneurPulse.com.au  Fact Sheet for Healthcare Providers:  IncredibleEmployment.be  This test is no t yet approved or cleared by the [place] FDA and  has been authorized for detection and/or diagnosis of SARS-CoV-2 by FDA under an Emergency Use Authorization (EUA). This EUA will remain  in effect (meaning this test can be used) for the duration of the COVID-19 declaration under Section 564(b)(1) of the Act, 21 U.S.C.section 360bbb-3(b)(1), unless the authorization is terminated  or revoked sooner.       Influenza A by PCR NEGATIVE NEGATIVE Final   Influenza B by PCR NEGATIVE NEGATIVE Final  Comment: (NOTE) The Xpert Xpress SARS-CoV-2/FLU/RSV plus assay is intended as an aid in the diagnosis of influenza from Nasopharyngeal swab specimens and should not be used as a sole basis for treatment. Nasal washings and aspirates are unacceptable for Xpert Xpress SARS-CoV-2/FLU/RSV testing.  Fact Sheet for Patients: EntrepreneurPulse.com.au  Fact Sheet for Healthcare Providers: IncredibleEmployment.be  This test is not yet approved or cleared by the [place] FDA  and has been authorized for detection and/or diagnosis of SARS-CoV-2 by FDA under an Emergency Use Authorization (EUA). This EUA will remain in effect (meaning this test can be used) for the duration of the COVID-19 declaration under Section 564(b)(1) of the Act, 21 U.S.C. section 360bbb-3(b)(1), unless the authorization is terminated or revoked.     Resp Syncytial Virus by PCR NEGATIVE NEGATIVE Final    Comment: (NOTE) Fact Sheet for Patients: EntrepreneurPulse.com.au  Fact Sheet for Healthcare Providers: IncredibleEmployment.be  This test is not yet approved or cleared by the [place] FDA and has been authorized for detection and/or diagnosis of SARS-CoV-2 by FDA under an Emergency Use Authorization (EUA). This EUA will remain in effect (meaning this test can be used) for the duration of the COVID-19 declaration under Section 564(b)(1) of the Act, 21 U.S.C. section 360bbb-3(b)(1), unless the authorization is terminated or revoked.  Performed at [facility], [address], [place], [place] [address]     Radiology Studies: DG Chest 2 View  Result Date: 09/18/2022 CLINICAL DATA:  Shortness of breath. EXAM: CHEST - 2 VIEW COMPARISON:  06/11/2009 FINDINGS: The lungs are clear without focal pneumonia, edema, pneumothorax or pleural effusion. Stable nodule peripheral right lower lung, likely calcified granuloma. The cardiopericardial silhouette is within normal limits for size. Streaky opacity at the left base is compatible with atelectasis or scarring. The visualized bony structures of the thorax are unremarkable. IMPRESSION: No active cardiopulmonary disease. Electronically Signed   By: Misty Stanley M.D.   On: 09/18/2022 15:36    Scheduled Meds:  amLODipine  10 mg Oral Daily   atorvastatin  20 mg Oral Daily   enoxaparin (LOVENOX) injection  40 mg Subcutaneous Q24H   hydrALAZINE  25 mg Oral Q8H   ipratropium-albuterol  3 mL Nebulization Q6H    methylPREDNISolone (SOLU-MEDROL) injection  60 mg Intravenous Q12H   nicotine  21 mg Transdermal Daily   pantoprazole  40 mg Oral Daily   prasugrel  10 mg Oral Daily   tamsulosin  0.4 mg Oral Daily   Continuous Infusions:  albuterol     cefTRIAXone (ROCEPHIN)  IV 1 g (09/19/22 1819)     LOS: 2 days    Time spent: 35 mins    Jacara Benito, MD [facility]   If 7PM-7AM, please contact night-coverage

## 2022-09-20 NOTE — Progress Notes (Addendum)
Patient went to Bathroom became very sob . Coughing up sputum. Wheezing through basically gave patient 10 mg albuterol / 0.5 mg atrovent via nebulizer . Oxygen increased from 10 to 14 lpm. Exacerbation sounds more like bronchitis.

## 2022-09-20 NOTE — Progress Notes (Signed)
Pt spo2 88% on 12 HFNC before treatment, increased to 95% with treatment. O2 increased to 15 lpm HFNC

## 2022-09-21 DIAGNOSIS — J441 Chronic obstructive pulmonary disease with (acute) exacerbation: Secondary | ICD-10-CM | POA: Diagnosis not present

## 2022-09-21 NOTE — Progress Notes (Signed)
PROGRESS NOTE    Christopher Conley  BTD:[id number] DOB: 01-Feb-1956 DOA: 09/18/2022  PCP: Neale Burly, MD   Brief Narrative:  This 67 years old male with PMH significant for COPD, ongoing tobacco abuse, CAD, hypertension and hyperlipidemia presented in the ED with complaints of shortness of breath, productive cough for last 2 weeks. He has been using his regular inhalers without any help,  also has worsening wheezing which prompted him to come to the ED for further evaluation.  Patient is admitted for acute hypoxic respiratory failure secondary to COPD exacerbation requiring Solu-Medrol and nebulized bronchodilators.  Assessment & Plan:   Principal Problem:   COPD exacerbation ([place]) Active Problems:   CAD (coronary artery disease)   Tobacco abuse   Essential hypertension  Acute hypoxic respiratory failure: COPD exacerbation: Patient presented with shortness of breath, productive cough, chest congestion and found to have SpO2 of 85% on room air requiring 5 L of supplemental oxygen. He also has significant wheezing noted on exam and started on IV Solu-Medrol and nebulized bronchodilators.  He is now requiring 14 L of high flow oxygen. Continue IV Solu-Medrol 60 mg every 12 hours,  Continue scheduled and as needed nebulized bronchodilators. Continue antitussives. Continue IV Rocephin.  Respiratory panel negative. Procalcitonin 0.10 Continue supplemental oxygen and wean as tolerated. Consider pulmonology consult if no improvement.  History of CAD: Continue Effient, metoprolol and statins. He denies any chest pain.  Essential hypertension: Discontinue metoprolol due to COPD. Continue amlodipine 10 mg daily. Start hydralazine 25 mg 3 times daily. Continue to monitor blood pressure.  Hyperlipidemia: Continue Lipitor 20 mg daily  Tobacco use: Patient was counseled about quit smoking.   He is agreeable and started on nicotine patch.  Obesity: Diet and exercise discussed in  detail. Estimated body mass index is 34.06 kg/m as calculated from the following:   Height as of this encounter: '5\' 8"'$  (1.727 m).   Weight as of this encounter: 101.6 kg.    DVT prophylaxis: Lovenox Code Status: Full code Family Communication: No family at bedside. Disposition Plan:   Status is: Inpatient Remains inpatient appropriate because: Admitted for acute hypoxic respiratory failure secondary to COPD exacerbation,  requiring IV Solu-Medrol and supplemental oxygen. Patient is now requiring 14 L of high flow oxygen.   Consultants:  None  Procedures: None  Antimicrobials: Ceftriaxone  Subjective: Patient was seen and examined at bedside.  Overnight events noted. He reports feeling better, still remains on 14 L of HFNC oxygen.  Objective: Vitals:   09/20/22 2016 09/20/22 2043 09/21/22 0442 09/21/22 0834  BP:  (!) 161/56 134/80   Pulse:  (!) 46 (!) 41   Resp:  20 20   Temp:  98.1 F (36.7 C) 97.7 F (36.5 C)   TempSrc:  Oral Oral   SpO2: (!) 88% 93% 99% 93%  Weight:      Height:        Intake/Output Summary (Last 24 hours) at 09/21/2022 1126 Last data filed at 09/21/2022 0000 Gross per 24 hour  Intake 240 ml  Output --  Net 240 ml   Filed Weights   09/18/22 1419  Weight: 101.6 kg    Examination:  General exam: Appears comfortable, not in any acute distress.  Deconditioned Respiratory system: CTA bilaterally, respiratory effort normal, no accessory muscle use, RR 14. Cardiovascular system: S1 & S2 heard, regular rate and rhythm, no murmur. Gastrointestinal system: Abdomen is soft, non tender, non distended, BS+ Central nervous system: Alert and oriented X  3. No focal neurological deficits. Extremities: No edema, no cyanosis, no clubbing Skin: No rashes, lesions or ulcers Psychiatry: Judgement and insight appear normal. Mood & affect appropriate.     Data Reviewed: I have personally reviewed following labs and imaging studies  CBC: Recent Labs  Lab  09/18/22 1445 09/19/22 0446  WBC 9.9 12.7*  NEUTROABS 7.0  --   HGB 16.0 15.5  HCT 49.8 47.7  MCV 94.1 93.2  PLT 243 448   Basic Metabolic Panel: Recent Labs  Lab 09/18/22 1445 09/19/22 0446  NA 138 136  K 4.6 4.2  CL 104 103  CO2 25 24  GLUCOSE 94 136*  BUN 18 23  CREATININE 0.81 0.81  CALCIUM 8.8* 8.5*   GFR: Estimated Creatinine Clearance: 102.3 mL/min (by C-G formula based on SCr of 0.81 mg/dL). Liver Function Tests: No results for input(s): "AST", "ALT", "ALKPHOS", "BILITOT", "PROT", "ALBUMIN" in the last 168 hours. No results for input(s): "LIPASE", "AMYLASE" in the last 168 hours. No results for input(s): "AMMONIA" in the last 168 hours. Coagulation Profile: No results for input(s): "INR", "PROTIME" in the last 168 hours. Cardiac Enzymes: No results for input(s): "CKTOTAL", "CKMB", "CKMBINDEX", "TROPONINI" in the last 168 hours. BNP (last 3 results) No results for input(s): "PROBNP" in the last 8760 hours. HbA1C: No results for input(s): "HGBA1C" in the last 72 hours. CBG: No results for input(s): "GLUCAP" in the last 168 hours. Lipid Profile: No results for input(s): "CHOL", "HDL", "LDLCALC", "TRIG", "CHOLHDL", "LDLDIRECT" in the last 72 hours. Thyroid Function Tests: No results for input(s): "TSH", "T4TOTAL", "FREET4", "T3FREE", "THYROIDAB" in the last 72 hours. Anemia Panel: No results for input(s): "VITAMINB12", "FOLATE", "FERRITIN", "TIBC", "IRON", "RETICCTPCT" in the last 72 hours. Sepsis Labs: Recent Labs  Lab 09/20/22 0953  PROCALCITON <0.10    Recent Results (from the past 240 hour(s))  Resp panel by RT-PCR (RSV, Flu A&B, Covid) Anterior Nasal Swab     Status: None   Collection Time: 09/18/22  2:55 PM   Specimen: Anterior Nasal Swab  Result Value Ref Range Status   SARS Coronavirus 2 by RT PCR NEGATIVE NEGATIVE Final    Comment: (NOTE) SARS-CoV-2 target nucleic acids are NOT DETECTED.  The SARS-CoV-2 RNA is generally detectable in upper  respiratory specimens during the acute phase of infection. The lowest concentration of SARS-CoV-2 viral copies this assay can detect is 138 copies/mL. A negative result does not preclude SARS-Cov-2 infection and should not be used as the sole basis for treatment or other patient management decisions. A negative result may occur with  improper specimen collection/handling, submission of specimen other than nasopharyngeal swab, presence of viral mutation(s) within the areas targeted by this assay, and inadequate number of viral copies(<138 copies/mL). A negative result must be combined with clinical observations, patient history, and epidemiological information. The expected result is Negative.  Fact Sheet for Patients:  EntrepreneurPulse.com.au  Fact Sheet for Healthcare Providers:  IncredibleEmployment.be  This test is no t yet approved or cleared by the [place] FDA and  has been authorized for detection and/or diagnosis of SARS-CoV-2 by FDA under an Emergency Use Authorization (EUA). This EUA will remain  in effect (meaning this test can be used) for the duration of the COVID-19 declaration under Section 564(b)(1) of the Act, 21 U.S.C.section 360bbb-3(b)(1), unless the authorization is terminated  or revoked sooner.       Influenza A by PCR NEGATIVE NEGATIVE Final   Influenza B by PCR NEGATIVE NEGATIVE Final  Comment: (NOTE) The Xpert Xpress SARS-CoV-2/FLU/RSV plus assay is intended as an aid in the diagnosis of influenza from Nasopharyngeal swab specimens and should not be used as a sole basis for treatment. Nasal washings and aspirates are unacceptable for Xpert Xpress SARS-CoV-2/FLU/RSV testing.  Fact Sheet for Patients: EntrepreneurPulse.com.au  Fact Sheet for Healthcare Providers: IncredibleEmployment.be  This test is not yet approved or cleared by the [place] FDA and has been  authorized for detection and/or diagnosis of SARS-CoV-2 by FDA under an Emergency Use Authorization (EUA). This EUA will remain in effect (meaning this test can be used) for the duration of the COVID-19 declaration under Section 564(b)(1) of the Act, 21 U.S.C. section 360bbb-3(b)(1), unless the authorization is terminated or revoked.     Resp Syncytial Virus by PCR NEGATIVE NEGATIVE Final    Comment: (NOTE) Fact Sheet for Patients: EntrepreneurPulse.com.au  Fact Sheet for Healthcare Providers: IncredibleEmployment.be  This test is not yet approved or cleared by the [place] FDA and has been authorized for detection and/or diagnosis of SARS-CoV-2 by FDA under an Emergency Use Authorization (EUA). This EUA will remain in effect (meaning this test can be used) for the duration of the COVID-19 declaration under Section 564(b)(1) of the Act, 21 U.S.C. section 360bbb-3(b)(1), unless the authorization is terminated or revoked.  Performed at [facility], [address], [place], [place] [address]     Radiology Studies: No results found.  Scheduled Meds:  amLODipine  10 mg Oral Daily   atorvastatin  20 mg Oral Daily   enoxaparin (LOVENOX) injection  40 mg Subcutaneous Q24H   hydrALAZINE  25 mg Oral Q8H   ipratropium-albuterol  3 mL Nebulization Q6H   methylPREDNISolone (SOLU-MEDROL) injection  60 mg Intravenous Q12H   nicotine  21 mg Transdermal Daily   pantoprazole  40 mg Oral Daily   prasugrel  10 mg Oral Daily   tamsulosin  0.4 mg Oral Daily   Continuous Infusions:  albuterol     cefTRIAXone (ROCEPHIN)  IV Stopped (09/20/22 1807)     LOS: 3 days    Time spent: 35 mins    Neeley Sedivy, MD [facility]   If 7PM-7AM, please contact night-coverage

## 2022-09-22 DIAGNOSIS — J441 Chronic obstructive pulmonary disease with (acute) exacerbation: Secondary | ICD-10-CM | POA: Diagnosis not present

## 2022-09-22 LAB — GLUCOSE, CAPILLARY: Glucose-Capillary: 200 mg/dL — ABNORMAL HIGH (ref 70–99)

## 2022-09-22 LAB — MAGNESIUM: Magnesium: 2.2 mg/dL (ref 1.7–2.4)

## 2022-09-22 LAB — PHOSPHORUS: Phosphorus: 3.2 mg/dL (ref 2.5–4.6)

## 2022-09-22 LAB — BASIC METABOLIC PANEL
Anion gap: 11 (ref 5–15)
BUN: 27 mg/dL — ABNORMAL HIGH (ref 8–23)
CO2: 22 mmol/L (ref 22–32)
Calcium: 8.2 mg/dL — ABNORMAL LOW (ref 8.9–10.3)
Chloride: 100 mmol/L (ref 98–111)
Creatinine, Ser: 1.04 mg/dL (ref 0.61–1.24)
GFR, Estimated: >60 mL/min (ref >60)
Glucose, Bld: 211 mg/dL — ABNORMAL HIGH (ref 70–99)
Potassium: 4.3 mmol/L (ref 3.5–5.1)
Sodium: 133 mmol/L — ABNORMAL LOW (ref 135–145)

## 2022-09-22 LAB — CBC
HCT: 49.5 % (ref 39.0–52.0)
Hemoglobin: 15.9 g/dL (ref 13.0–17.0)
MCH: 30.1 pg (ref 26.0–34.0)
MCHC: 32.1 g/dL (ref 30.0–36.0)
MCV: 93.6 fL (ref 80.0–100.0)
Platelets: 284 10*3/uL (ref 150–400)
RBC: 5.29 MIL/uL (ref 4.22–5.81)
RDW: 13.5 % (ref 11.5–15.5)
WBC: 15.2 10*3/uL — ABNORMAL HIGH (ref 4.0–10.5)
nRBC: 0 % (ref 0.0–0.2)

## 2022-09-22 MED ORDER — HYDRALAZINE HCL 25 MG PO TABS
50.0000 mg | ORAL_TABLET | Freq: Three times a day (TID) | ORAL | Status: DC
  Administered 2022-09-22 – 2022-09-25 (×9): 50 mg via ORAL
  Filled 2022-09-22 (×9): qty 2

## 2022-09-22 NOTE — Progress Notes (Signed)
PROGRESS NOTE    Christopher Conley  PXT:[id number] DOB: Apr 08, 1956 DOA: 09/18/2022  PCP: Neale Burly, MD   Brief Narrative:  This 67 years old male with PMH significant for COPD, ongoing tobacco abuse, CAD, hypertension and hyperlipidemia presented in the ED with complaints of shortness of breath, productive cough for last 2 weeks. He has been using his regular inhalers without any help,  also has worsening wheezing which prompted him to come to the ED for further evaluation.  Patient is admitted for acute hypoxic respiratory failure secondary to COPD exacerbation requiring Solu-Medrol and nebulized bronchodilators.  Assessment & Plan:   Principal Problem:   COPD exacerbation ([place]) Active Problems:   CAD (coronary artery disease)   Tobacco abuse   Essential hypertension  Acute hypoxic respiratory failure: COPD exacerbation: Patient presented with shortness of breath, productive cough, chest congestion and found to have SpO2 of 85% on room air requiring 5 L of supplemental oxygen on arrival. He also has significant wheezing noted on exam and started on IV Solu-Medrol and nebulized bronchodilators.  He has required up to 14 L of high flow oxygen now weaned down to 10 L/min. Continue IV Solu-Medrol 60 mg every 12 hours,  Continue scheduled and as needed nebulized bronchodilators. Continue antitussives. Continue IV Rocephin.  Respiratory panel negative. Procalcitonin 0.10 Continue supplemental oxygen and wean as tolerated. Continue inspiratory spirometry and flutter valve Consider pulmonology consult if no improvement.  History of CAD: Continue Effient, metoprolol and statins. He denies any chest pain.  Essential hypertension: Discontinue metoprolol due to COPD. Continue amlodipine 10 mg daily. Increase hydralazine to 50 mg 3 times daily. Continue to monitor blood pressure.  Hyperlipidemia: Continue Lipitor 20 mg daily  Tobacco use: Patient was counseled about quit smoking.    He is agreeable and started on nicotine patch.  Obesity: Diet and exercise discussed in detail. Estimated body mass index is 34.06 kg/m as calculated from the following:   Height as of this encounter: '5\' 8"'$  (1.727 m).   Weight as of this encounter: 101.6 kg.    DVT prophylaxis: Lovenox Code Status: Full code Family Communication: No family at bedside. Disposition Plan:   Status is: Inpatient Remains inpatient appropriate because: Admitted for acute hypoxic respiratory failure secondary to COPD exacerbation,  requiring IV Solu-Medrol and supplemental oxygen. Patient is now requiring 10 L of high flow oxygen.   Consultants:  None  Procedures: None  Antimicrobials: Ceftriaxone  Subjective: Patient was seen and examined at bedside.  Overnight events noted. Patient reports doing much better,  continues to have inspiratory spirometry.   He is weaned down to 10 L of high flow nasal cannula now.  Objective: Vitals:   09/21/22 2156 09/22/22 0215 09/22/22 0418 09/22/22 0854  BP: (!) 182/80  (!) 172/84   Pulse: 74  71   Resp: 20  20   Temp: 98.3 F (36.8 C)  97.6 F (36.4 C)   TempSrc: Oral  Oral   SpO2: 94% 90% 95% 96%  Weight:      Height:        Intake/Output Summary (Last 24 hours) at 09/22/2022 1044 Last data filed at 09/22/2022 0000 Gross per 24 hour  Intake 720 ml  Output --  Net 720 ml   Filed Weights   09/18/22 1419  Weight: 101.6 kg    Examination:  General exam: Appears comfortable, not in any acute distress.  Deconditioned Respiratory system: CTA bilaterally, respiratory effort normal, no accessory muscle use, RR 13. Cardiovascular  system: S1 & S2 heard, regular rate and rhythm, no murmur. Gastrointestinal system: Abdomen is soft, non tender, non distended, BS+ Central nervous system: Alert and oriented X 3. No focal neurological deficits. Extremities: No edema, no cyanosis, no clubbing Skin: No rashes, lesions or ulcers Psychiatry: Judgement and  insight appear normal. Mood & affect appropriate.     Data Reviewed: I have personally reviewed following labs and imaging studies  CBC: Recent Labs  Lab 09/18/22 1445 09/19/22 0446 09/22/22 0407  WBC 9.9 12.7* 15.2*  NEUTROABS 7.0  --   --   HGB 16.0 15.5 15.9  HCT 49.8 47.7 49.5  MCV 94.1 93.2 93.6  PLT 243 272 161   Basic Metabolic Panel: Recent Labs  Lab 09/18/22 1445 09/19/22 0446 09/22/22 0407  NA 138 136 133*  K 4.6 4.2 4.3  CL 104 103 100  CO2 '25 24 22  '$ GLUCOSE 94 136* 211*  BUN 18 23 27*  CREATININE 0.81 0.81 1.04  CALCIUM 8.8* 8.5* 8.2*  MG  --   --  2.2  PHOS  --   --  3.2   GFR: Estimated Creatinine Clearance: 79.6 mL/min (by C-G formula based on SCr of 1.04 mg/dL). Liver Function Tests: No results for input(s): "AST", "ALT", "ALKPHOS", "BILITOT", "PROT", "ALBUMIN" in the last 168 hours. No results for input(s): "LIPASE", "AMYLASE" in the last 168 hours. No results for input(s): "AMMONIA" in the last 168 hours. Coagulation Profile: No results for input(s): "INR", "PROTIME" in the last 168 hours. Cardiac Enzymes: No results for input(s): "CKTOTAL", "CKMB", "CKMBINDEX", "TROPONINI" in the last 168 hours. BNP (last 3 results) No results for input(s): "PROBNP" in the last 8760 hours. HbA1C: No results for input(s): "HGBA1C" in the last 72 hours. CBG: No results for input(s): "GLUCAP" in the last 168 hours. Lipid Profile: No results for input(s): "CHOL", "HDL", "LDLCALC", "TRIG", "CHOLHDL", "LDLDIRECT" in the last 72 hours. Thyroid Function Tests: No results for input(s): "TSH", "T4TOTAL", "FREET4", "T3FREE", "THYROIDAB" in the last 72 hours. Anemia Panel: No results for input(s): "VITAMINB12", "FOLATE", "FERRITIN", "TIBC", "IRON", "RETICCTPCT" in the last 72 hours. Sepsis Labs: Recent Labs  Lab 09/20/22 0953  PROCALCITON <0.10    Recent Results (from the past 240 hour(s))  Resp panel by RT-PCR (RSV, Flu A&B, Covid) Anterior Nasal Swab      Status: None   Collection Time: 09/18/22  2:55 PM   Specimen: Anterior Nasal Swab  Result Value Ref Range Status   SARS Coronavirus 2 by RT PCR NEGATIVE NEGATIVE Final    Comment: (NOTE) SARS-CoV-2 target nucleic acids are NOT DETECTED.  The SARS-CoV-2 RNA is generally detectable in upper respiratory specimens during the acute phase of infection. The lowest concentration of SARS-CoV-2 viral copies this assay can detect is 138 copies/mL. A negative result does not preclude SARS-Cov-2 infection and should not be used as the sole basis for treatment or other patient management decisions. A negative result may occur with  improper specimen collection/handling, submission of specimen other than nasopharyngeal swab, presence of viral mutation(s) within the areas targeted by this assay, and inadequate number of viral copies(<138 copies/mL). A negative result must be combined with clinical observations, patient history, and epidemiological information. The expected result is Negative.  Fact Sheet for Patients:  EntrepreneurPulse.com.au  Fact Sheet for Healthcare Providers:  IncredibleEmployment.be  This test is no t yet approved or cleared by the [place] FDA and  has been authorized for detection and/or diagnosis of SARS-CoV-2 by FDA under an Emergency  Use Authorization (EUA). This EUA will remain  in effect (meaning this test can be used) for the duration of the COVID-19 declaration under Section 564(b)(1) of the Act, 21 U.S.C.section 360bbb-3(b)(1), unless the authorization is terminated  or revoked sooner.       Influenza A by PCR NEGATIVE NEGATIVE Final   Influenza B by PCR NEGATIVE NEGATIVE Final    Comment: (NOTE) The Xpert Xpress SARS-CoV-2/FLU/RSV plus assay is intended as an aid in the diagnosis of influenza from Nasopharyngeal swab specimens and should not be used as a sole basis for treatment. Nasal washings and aspirates are  unacceptable for Xpert Xpress SARS-CoV-2/FLU/RSV testing.  Fact Sheet for Patients: EntrepreneurPulse.com.au  Fact Sheet for Healthcare Providers: IncredibleEmployment.be  This test is not yet approved or cleared by the [place] FDA and has been authorized for detection and/or diagnosis of SARS-CoV-2 by FDA under an Emergency Use Authorization (EUA). This EUA will remain in effect (meaning this test can be used) for the duration of the COVID-19 declaration under Section 564(b)(1) of the Act, 21 U.S.C. section 360bbb-3(b)(1), unless the authorization is terminated or revoked.     Resp Syncytial Virus by PCR NEGATIVE NEGATIVE Final    Comment: (NOTE) Fact Sheet for Patients: EntrepreneurPulse.com.au  Fact Sheet for Healthcare Providers: IncredibleEmployment.be  This test is not yet approved or cleared by the [place] FDA and has been authorized for detection and/or diagnosis of SARS-CoV-2 by FDA under an Emergency Use Authorization (EUA). This EUA will remain in effect (meaning this test can be used) for the duration of the COVID-19 declaration under Section 564(b)(1) of the Act, 21 U.S.C. section 360bbb-3(b)(1), unless the authorization is terminated or revoked.  Performed at [facility], [address], [place], [place] [address]     Radiology Studies: No results found.  Scheduled Meds:  amLODipine  10 mg Oral Daily   atorvastatin  20 mg Oral Daily   enoxaparin (LOVENOX) injection  40 mg Subcutaneous Q24H   hydrALAZINE  50 mg Oral Q8H   ipratropium-albuterol  3 mL Nebulization Q6H   methylPREDNISolone (SOLU-MEDROL) injection  60 mg Intravenous Q12H   nicotine  21 mg Transdermal Daily   pantoprazole  40 mg Oral Daily   prasugrel  10 mg Oral Daily   tamsulosin  0.4 mg Oral Daily   Continuous Infusions:  albuterol     cefTRIAXone (ROCEPHIN)  IV Stopped (09/21/22 1819)     LOS: 4 days     Time spent: 35 mins    Daylon Lafavor, MD [facility]   If 7PM-7AM, please contact night-coverage

## 2022-09-22 NOTE — Progress Notes (Signed)
Pt up and down independently in the room this shift. Continues to use incentive spirometry and shows compliance with cough and deep breath exercises. Pt currently weaned to 8 L nasal canula and tolerating well, O2 sat 95%.

## 2022-09-23 ENCOUNTER — Other Ambulatory Visit (HOSPITAL_COMMUNITY): Payer: Self-pay | Admitting: *Deleted

## 2022-09-23 ENCOUNTER — Other Ambulatory Visit (HOSPITAL_COMMUNITY): Payer: Medicare HMO

## 2022-09-23 ENCOUNTER — Telehealth: Payer: Self-pay | Admitting: Pulmonary Disease

## 2022-09-23 DIAGNOSIS — J441 Chronic obstructive pulmonary disease with (acute) exacerbation: Secondary | ICD-10-CM | POA: Diagnosis not present

## 2022-09-23 DIAGNOSIS — J9601 Acute respiratory failure with hypoxia: Secondary | ICD-10-CM

## 2022-09-23 DIAGNOSIS — R058 Other specified cough: Secondary | ICD-10-CM

## 2022-09-23 MED ORDER — SALINE SPRAY 0.65 % NA SOLN
2.0000 | Freq: Two times a day (BID) | NASAL | Status: DC
  Administered 2022-09-23 – 2022-09-25 (×5): 2 via NASAL
  Filled 2022-09-23: qty 44

## 2022-09-23 MED ORDER — REVEFENACIN 175 MCG/3ML IN SOLN
175.0000 ug | Freq: Every day | RESPIRATORY_TRACT | Status: DC
  Administered 2022-09-23 – 2022-09-25 (×3): 175 ug via RESPIRATORY_TRACT
  Filled 2022-09-23 (×3): qty 3

## 2022-09-23 MED ORDER — AZELASTINE HCL 0.1 % NA SOLN
1.0000 | Freq: Every morning | NASAL | Status: DC
  Administered 2022-09-23 – 2022-09-25 (×3): 1 via NASAL
  Filled 2022-09-23: qty 30

## 2022-09-23 MED ORDER — BUDESONIDE 0.5 MG/2ML IN SUSP
0.5000 mg | Freq: Two times a day (BID) | RESPIRATORY_TRACT | Status: DC
  Administered 2022-09-23 – 2022-09-25 (×4): 0.5 mg via RESPIRATORY_TRACT
  Filled 2022-09-23 (×4): qty 2

## 2022-09-23 MED ORDER — PREDNISONE 20 MG PO TABS
30.0000 mg | ORAL_TABLET | Freq: Every day | ORAL | Status: DC
  Administered 2022-09-24 – 2022-09-25 (×2): 30 mg via ORAL
  Filled 2022-09-23 (×2): qty 2

## 2022-09-23 MED ORDER — DOCUSATE SODIUM 100 MG PO CAPS
100.0000 mg | ORAL_CAPSULE | Freq: Two times a day (BID) | ORAL | Status: DC
  Administered 2022-09-23 – 2022-09-25 (×4): 100 mg via ORAL
  Filled 2022-09-23 (×4): qty 1

## 2022-09-23 MED ORDER — GUAIFENESIN ER 600 MG PO TB12
1200.0000 mg | ORAL_TABLET | Freq: Two times a day (BID) | ORAL | Status: DC
  Administered 2022-09-23 – 2022-09-25 (×5): 1200 mg via ORAL
  Filled 2022-09-23 (×5): qty 2

## 2022-09-23 MED ORDER — FLUTICASONE PROPIONATE 50 MCG/ACT NA SUSP
1.0000 | Freq: Every day | NASAL | Status: DC
  Administered 2022-09-23 – 2022-09-24 (×2): 1 via NASAL
  Filled 2022-09-23: qty 16

## 2022-09-23 MED ORDER — SENNOSIDES-DOCUSATE SODIUM 8.6-50 MG PO TABS
1.0000 | ORAL_TABLET | Freq: Two times a day (BID) | ORAL | Status: DC
  Administered 2022-09-23 – 2022-09-25 (×4): 1 via ORAL
  Filled 2022-09-23 (×4): qty 1

## 2022-09-23 MED ORDER — ARFORMOTEROL TARTRATE 15 MCG/2ML IN NEBU
15.0000 ug | INHALATION_SOLUTION | Freq: Two times a day (BID) | RESPIRATORY_TRACT | Status: DC
  Administered 2022-09-23 – 2022-09-25 (×4): 15 ug via RESPIRATORY_TRACT
  Filled 2022-09-23 (×4): qty 2

## 2022-09-23 NOTE — Progress Notes (Signed)
Patient has rested well this shift and has had no complaints.  Patient is currently on 8 liters of oxygen and has maintained oxygen saturation through the nights.  Vitals have been stable. No acute events.

## 2022-09-23 NOTE — Progress Notes (Signed)
Pt currently on 8 lpm HFNC with SaO2 of 94% and no SOB. Pt has had O2 up to as high at 12 lpm by RT staff today but we have been able to wean him back to 8 lpm. Pt using IS and flutter valve appropriately and without reminder. No c/o pain. Has been ambulatory in room today without difficulty. Pt states no BM x 4 days, MD notified with request for laxative and.or stool softner.

## 2022-09-23 NOTE — Consult Note (Signed)
NAME:  Christopher Conley, MRN:  [id number], DOB:  1956-03-19, LOS: 5 ADMISSION DATE:  09/18/2022, CONSULTATION DATE:  09/23/2022 REFERRING MD:  Dr. Dwyane Dee, [facility], CHIEF COMPLAINT:  Short of breath   History of Present Illness:  67 yo male smoker presented to [facility] with 2 weeks of dyspnea, wheezing and productive cough.  SpO2 85% on room air in ER.  Started on therapy for COPD exacerbation.  He had continued increased supplemental oxygen requirements and PCCM consulted to assess.  Pertinent  Medical History  Arthritis, Bipolar, CAD, HTN, HLD  Studies:  CT chest 10/26/20 >> atherosclerosis, calcified mediastinal and Rt hilar LN, diffuse bronchial wall thickening, calcified nodule RLL, fine centrilobular nodularity more in lung apices (smoking RB) PFT 11/05/20 >> FEV1 1.76 (55%), FEV1% 55, TLC 10.80 (163%), DLCO 73%, +BD  Interim History / Subjective:  He was previously seen by Dr. Lenice Llamas.  He used to smoke 2 ppd, but is now down to 1 ppd.  He has sinus congestion with post nasal drip.  He has globus sensation and gets wheeze from his throat area.  He also has some wheezing in his chest.  He has cough but has trouble bring up phlegm.  Denies chest pain or leg swelling.  He had heart stents put in several years ago, and is worried these might have closed off.  Objective   Blood pressure (!) 162/90, pulse 75, temperature 97.6 F (36.4 C), temperature source Oral, resp. rate (!) 21, height '5\' 8"'$  (1.727 m), weight 101.6 kg, SpO2 94 %.        Intake/Output Summary (Last 24 hours) at 09/23/2022 1134 Last data filed at 09/22/2022 2250 Gross per 24 hour  Intake --  Output 1 ml  Net -1 ml   Filed Weights   09/18/22 1419  Weight: 101.6 kg    Examination:  General - alert Eyes - pupils reactive ENT - no sinus tenderness, no stridor, wearing oxygen Cardiac - regular rate/rhythm, no murmur Chest - faint wheezing bilaterally Abdomen - soft, non tender, + bowel sounds Extremities - no cyanosis,  clubbing, or edema Skin - no rashes Neuro - normal strength, moves extremities, follows commands Psych - normal mood and behavior Lymphatics - no lymphadenopathy   Assessment & Plan:   Acute hypoxic respiratory failure. - wonder if he might have needed supplemental oxygen prior to this admission - adjust oxygen to keep SpO2 90 to 95% - check CXR and Echo >> depending on results might need CT imaging of chest to assess further  Acute COPD exacerbation. - change to prednisone 30 mg daily on 1/10 and wean off as tolerated - change to yupelri, brovana, pulmicort - prn albuterol - add flutter valve, mucinex - continue incentive spirometry - repeat 2 view CXR on 1/10 - completed ABx on 1/08  Tobacco abuse. - nicotine patch - he would be a candidate for lung cancer screening as an outpatient  Hx of coronary artery disease, hypertension, hyperlipidemia. - will arrange for Echocardiogram to assess LV and RV function  Upper airway cough syndrome with post nasal drip and globus sensation. - add fluticasone, azelastine and nasal irrigation  Labs       Latest Ref Rng & Units 09/22/2022    4:07 AM 09/19/2022    4:46 AM 09/18/2022    2:45 PM  CMP  Glucose 70 - 99 mg/dL 211  136  94   BUN 8 - 23 mg/dL '27  23  18   '$ Creatinine 0.61 -  1.24 mg/dL 1.04  0.81  0.81   Sodium 135 - 145 mmol/L 133  136  138   Potassium 3.5 - 5.1 mmol/L 4.3  4.2  4.6   Chloride 98 - 111 mmol/L 100  103  104   CO2 22 - 32 mmol/L '22  24  25   '$ Calcium 8.9 - 10.3 mg/dL 8.2  8.5  8.8        Latest Ref Rng & Units 09/22/2022    4:07 AM 09/19/2022    4:46 AM 09/18/2022    2:45 PM  CBC  WBC 4.0 - 10.5 K/uL 15.2  12.7  9.9   Hemoglobin 13.0 - 17.0 g/dL 15.9  15.5  16.0   Hematocrit 39.0 - 52.0 % 49.5  47.7  49.8   Platelets 150 - 400 K/uL 284  272  243     CBG (last 3)  Recent Labs    09/22/22 1250  GLUCAP 200*    ABG    Component Value Date/Time   HCO3 29.2 (H) 09/18/2022 1445   TCO2 21 06/10/2009 1953    O2SAT 90.3 09/18/2022 1445    Review of Systems:   Reviewed and negative  Past Medical History:  He,  has a past medical history of Arthritis, Bipolar disorder ([place]), COPD (chronic obstructive pulmonary disease) ([place]), Coronary artery disease, Hypertension, and Myocardial infarction ([place]).   Surgical History:   Past Surgical History:  Procedure Laterality Date   ANTERIOR LAT LUMBAR FUSION  2008   APPENDECTOMY     CARDIAC CATHETERIZATION     COLONOSCOPY WITH PROPOFOL N/A 04/18/2016   Procedure: COLONOSCOPY WITH PROPOFOL;  Surgeon: Rogene Houston, MD;  [facility];  Service: Endoscopy;  Laterality: N/A;  [place]     Umbilical   POLYPECTOMY  04/18/2016   Procedure: POLYPECTOMY;  Surgeon: Rogene Houston, MD;  Location: [facility];  Service: Endoscopy;;  hepatic flexure, splenic flexuere,and sigmoid polypectomies     Social History:   reports that he has been smoking cigarettes. He started smoking about 54 years ago. He has a 50.00 pack-year smoking history. He has never used smokeless tobacco. He reports that he does not drink alcohol and does not use drugs.   Family History:  His family history includes CAD in his brother; COPD in his brother.   Allergies No Known Allergies   Home Medications  Prior to Admission medications   Medication Sig Start Date End Date Taking? Authorizing Provider  acetaminophen (TYLENOL) 500 MG tablet Take 1,000 mg by mouth every 6 (six) hours as needed for mild pain.   Yes [provider]  amLODipine (NORVASC) 10 MG tablet Take 10 mg by mouth daily.   Yes [provider]  azithromycin (ZITHROMAX) 250 MG tablet Take 1 tablet (250 mg total) by mouth daily. Take first 2 tablets together, then 1 every day until finished. 09/18/22  Yes Hayden Rasmussen, MD  EFFIENT 10 MG TABS tablet Take 1 tablet (10 mg total) by mouth daily. 04/19/16  Yes Rehman, Mechele Dawley, MD  ipratropium-albuterol (DUONEB)  0.5-2.5 (3) MG/3ML SOLN INHALE CONTENTS OF 1 VIAL IN NEBULIZER FOUR TIMES DAILY 05/08/21  Yes [provider]  metoprolol (LOPRESSOR) 50 MG tablet Take 25 mg by mouth 2 (two) times daily. 01/17/16  Yes [provider]  predniSONE (DELTASONE) 50 MG tablet Take 1 tablet (50 mg total) by mouth daily. 09/18/22  Yes Aletta Edouard  C, MD  simvastatin (ZOCOR) 40 MG tablet Take 40 mg by mouth daily. 03/08/16  Yes [provider]  tamsulosin (FLOMAX) 0.4 MG CAPS capsule Take 0.4 mg by mouth daily. 03/24/16  Yes [provider]  VENTOLIN HFA 108 (90 Base) MCG/ACT inhaler Inhale 2 puffs into the lungs every 6 (six) hours as needed for wheezing or shortness of breath. as directed 01/16/21  Yes Spero Geralds, MD  citalopram (CELEXA) 20 MG tablet Take 1 tablet (20 mg total) by mouth daily. Patient not taking: Reported on 09/19/2022 01/16/21   Spero Geralds, MD  polyethylene glycol-electrolytes (TRILYTE) 420 g solution Take 4,000 mLs by mouth as directed. Patient not taking: Reported on 09/19/2022 05/15/22   Harvel Quale, MD     Signature:  Chesley Mires, MD [place] Pager - [phone] or [phone] 09/23/2022, 2:20 PM

## 2022-09-23 NOTE — Progress Notes (Signed)
PROGRESS NOTE    Christopher Conley  HFW:[id number] DOB: 1955-12-31 DOA: 09/18/2022  PCP: Neale Burly, MD   Brief Narrative:  This 67 years old male with PMH significant for COPD, ongoing tobacco abuse, CAD, hypertension and hyperlipidemia presented in the ED with complaints of shortness of breath, productive cough for last 2 weeks. He has been using his regular inhalers without any help,  also has worsening wheezing which prompted him to come to the ED for further evaluation.  Patient is admitted for acute hypoxic respiratory failure secondary to COPD exacerbation requiring Solu-Medrol and nebulized bronchodilators.  Assessment & Plan:   Principal Problem:   COPD exacerbation ([place]) Active Problems:   CAD (coronary artery disease)   Tobacco abuse   Essential hypertension  Acute hypoxic respiratory failure: COPD exacerbation: Patient presented with shortness of breath, productive cough, chest congestion and found to have SpO2 of 85% on room air requiring 5 L of supplemental oxygen on arrival. He also has significant wheezing noted on exam and started on IV Solu-Medrol and nebulized bronchodilators.   He has required up to 14 L of high flow oxygen now weaned down to 10 L/min. Continue IV Solu-Medrol 60 mg every 12 hours,  Continue scheduled and as needed nebulized bronchodilators. Continue antitussives. Continue IV Rocephin.  Respiratory panel negative. Procalcitonin 0.10 Continue supplemental oxygen and wean as tolerated. Continue inspiratory spirometry and flutter valve Pulmonology consulted, awaiting recommendation.  History of CAD: Continue Effient, metoprolol and statins. He denies any chest pain.  Essential hypertension: Discontinue metoprolol due to COPD. Continue amlodipine 10 mg daily. Continue hydralazine to 50 mg 3 times daily. Continue to monitor blood pressure.  Hyperlipidemia: Continue Lipitor 20 mg daily  Tobacco use: Patient was counseled about quit smoking.    He is agreeable and started on nicotine patch.  Obesity: Diet and exercise discussed in detail. Estimated body mass index is 34.06 kg/m as calculated from the following:   Height as of this encounter: '5\' 8"'$  (1.727 m).   Weight as of this encounter: 101.6 kg.    DVT prophylaxis: Lovenox Code Status: Full code Family Communication: No family at bedside. Disposition Plan:   Status is: Inpatient Remains inpatient appropriate because: Admitted for acute hypoxic respiratory failure secondary to COPD exacerbation,  requiring IV Solu-Medrol and supplemental oxygen. Patient is now requiring 10 L of high flow oxygen.   Consultants:  None  Procedures: None  Antimicrobials: Ceftriaxone  Subjective: Patient was seen and examined at bedside.  Overnight events noted. Doing much better, he continued to use inspiratory spirometry. He is weaned down to 10 L of high flow nasal cannula now.  Objective: Vitals:   09/22/22 2100 09/23/22 0402 09/23/22 0755 09/23/22 1040  BP: (!) 162/80 (!) 151/90  (!) 162/90  Pulse: 74 67  75  Resp: 20 20  (!) 21  Temp: (!) 97.4 F (36.3 C) 97.6 F (36.4 C)    TempSrc: Oral Oral    SpO2: 94% 91% 90% 94%  Weight:      Height:        Intake/Output Summary (Last 24 hours) at 09/23/2022 1135 Last data filed at 09/22/2022 2250 Gross per 24 hour  Intake --  Output 1 ml  Net -1 ml   Filed Weights   09/18/22 1419  Weight: 101.6 kg    Examination:  General exam: Appears comfortable, not in any acute distress.  Deconditioned Respiratory system: CTA bilaterally, respiratory for normal, RR 14. Cardiovascular system: S1 & S2 heard, regular  rate and rhythm, no murmur. Gastrointestinal system: Abdomen is soft, non tender, non distended, BS+ Central nervous system: Alert and oriented X 3. No focal neurological deficits. Extremities: No edema, no cyanosis, no clubbing Skin: No rashes, lesions or ulcers Psychiatry: Judgement and insight appear normal. Mood &  affect appropriate.     Data Reviewed: I have personally reviewed following labs and imaging studies  CBC: Recent Labs  Lab 09/18/22 1445 09/19/22 0446 09/22/22 0407  WBC 9.9 12.7* 15.2*  NEUTROABS 7.0  --   --   HGB 16.0 15.5 15.9  HCT 49.8 47.7 49.5  MCV 94.1 93.2 93.6  PLT 243 272 376   Basic Metabolic Panel: Recent Labs  Lab 09/18/22 1445 09/19/22 0446 09/22/22 0407  NA 138 136 133*  K 4.6 4.2 4.3  CL 104 103 100  CO2 '25 24 22  '$ GLUCOSE 94 136* 211*  BUN 18 23 27*  CREATININE 0.81 0.81 1.04  CALCIUM 8.8* 8.5* 8.2*  MG  --   --  2.2  PHOS  --   --  3.2   GFR: Estimated Creatinine Clearance: 79.6 mL/min (by C-G formula based on SCr of 1.04 mg/dL). Liver Function Tests: No results for input(s): "AST", "ALT", "ALKPHOS", "BILITOT", "PROT", "ALBUMIN" in the last 168 hours. No results for input(s): "LIPASE", "AMYLASE" in the last 168 hours. No results for input(s): "AMMONIA" in the last 168 hours. Coagulation Profile: No results for input(s): "INR", "PROTIME" in the last 168 hours. Cardiac Enzymes: No results for input(s): "CKTOTAL", "CKMB", "CKMBINDEX", "TROPONINI" in the last 168 hours. BNP (last 3 results) No results for input(s): "PROBNP" in the last 8760 hours. HbA1C: No results for input(s): "HGBA1C" in the last 72 hours. CBG: Recent Labs  Lab 09/22/22 1250  GLUCAP 200*   Lipid Profile: No results for input(s): "CHOL", "HDL", "LDLCALC", "TRIG", "CHOLHDL", "LDLDIRECT" in the last 72 hours. Thyroid Function Tests: No results for input(s): "TSH", "T4TOTAL", "FREET4", "T3FREE", "THYROIDAB" in the last 72 hours. Anemia Panel: No results for input(s): "VITAMINB12", "FOLATE", "FERRITIN", "TIBC", "IRON", "RETICCTPCT" in the last 72 hours. Sepsis Labs: Recent Labs  Lab 09/20/22 0953  PROCALCITON <0.10    Recent Results (from the past 240 hour(s))  Resp panel by RT-PCR (RSV, Flu A&B, Covid) Anterior Nasal Swab     Status: None   Collection Time: 09/18/22   2:55 PM   Specimen: Anterior Nasal Swab  Result Value Ref Range Status   SARS Coronavirus 2 by RT PCR NEGATIVE NEGATIVE Final    Comment: (NOTE) SARS-CoV-2 target nucleic acids are NOT DETECTED.  The SARS-CoV-2 RNA is generally detectable in upper respiratory specimens during the acute phase of infection. The lowest concentration of SARS-CoV-2 viral copies this assay can detect is 138 copies/mL. A negative result does not preclude SARS-Cov-2 infection and should not be used as the sole basis for treatment or other patient management decisions. A negative result may occur with  improper specimen collection/handling, submission of specimen other than nasopharyngeal swab, presence of viral mutation(s) within the areas targeted by this assay, and inadequate number of viral copies(<138 copies/mL). A negative result must be combined with clinical observations, patient history, and epidemiological information. The expected result is Negative.  Fact Sheet for Patients:  EntrepreneurPulse.com.au  Fact Sheet for Healthcare Providers:  IncredibleEmployment.be  This test is no t yet approved or cleared by the [place] FDA and  has been authorized for detection and/or diagnosis of SARS-CoV-2 by FDA under an Emergency Use Authorization (EUA). This EUA  will remain  in effect (meaning this test can be used) for the duration of the COVID-19 declaration under Section 564(b)(1) of the Act, 21 U.S.C.section 360bbb-3(b)(1), unless the authorization is terminated  or revoked sooner.       Influenza A by PCR NEGATIVE NEGATIVE Final   Influenza B by PCR NEGATIVE NEGATIVE Final    Comment: (NOTE) The Xpert Xpress SARS-CoV-2/FLU/RSV plus assay is intended as an aid in the diagnosis of influenza from Nasopharyngeal swab specimens and should not be used as a sole basis for treatment. Nasal washings and aspirates are unacceptable for Xpert Xpress  SARS-CoV-2/FLU/RSV testing.  Fact Sheet for Patients: EntrepreneurPulse.com.au  Fact Sheet for Healthcare Providers: IncredibleEmployment.be  This test is not yet approved or cleared by the [place] FDA and has been authorized for detection and/or diagnosis of SARS-CoV-2 by FDA under an Emergency Use Authorization (EUA). This EUA will remain in effect (meaning this test can be used) for the duration of the COVID-19 declaration under Section 564(b)(1) of the Act, 21 U.S.C. section 360bbb-3(b)(1), unless the authorization is terminated or revoked.     Resp Syncytial Virus by PCR NEGATIVE NEGATIVE Final    Comment: (NOTE) Fact Sheet for Patients: EntrepreneurPulse.com.au  Fact Sheet for Healthcare Providers: IncredibleEmployment.be  This test is not yet approved or cleared by the [place] FDA and has been authorized for detection and/or diagnosis of SARS-CoV-2 by FDA under an Emergency Use Authorization (EUA). This EUA will remain in effect (meaning this test can be used) for the duration of the COVID-19 declaration under Section 564(b)(1) of the Act, 21 U.S.C. section 360bbb-3(b)(1), unless the authorization is terminated or revoked.  Performed at [facility], [address] [place], [place] [address]     Radiology Studies: No results found.  Scheduled Meds:  amLODipine  10 mg Oral Daily   atorvastatin  20 mg Oral Daily   enoxaparin (LOVENOX) injection  40 mg Subcutaneous Q24H   hydrALAZINE  50 mg Oral Q8H   ipratropium-albuterol  3 mL Nebulization Q6H   methylPREDNISolone (SOLU-MEDROL) injection  60 mg Intravenous Q12H   nicotine  21 mg Transdermal Daily   pantoprazole  40 mg Oral Daily   prasugrel  10 mg Oral Daily   tamsulosin  0.4 mg Oral Daily   Continuous Infusions:  albuterol       LOS: 5 days    Time spent: 35 mins    Enaya Howze, MD [facility]   If  7PM-7AM, please contact night-coverage

## 2022-09-23 NOTE — Telephone Encounter (Signed)
Entered in error

## 2022-09-24 ENCOUNTER — Inpatient Hospital Stay (HOSPITAL_COMMUNITY): Payer: Medicare HMO

## 2022-09-24 ENCOUNTER — Telehealth: Payer: Self-pay

## 2022-09-24 DIAGNOSIS — R0609 Other forms of dyspnea: Secondary | ICD-10-CM

## 2022-09-24 DIAGNOSIS — J441 Chronic obstructive pulmonary disease with (acute) exacerbation: Secondary | ICD-10-CM | POA: Diagnosis not present

## 2022-09-24 LAB — ECHOCARDIOGRAM COMPLETE
AR max vel: 2.41 cm2
AV Area VTI: 2.55 cm2
AV Area mean vel: 2.37 cm2
AV Mean grad: 4 mmHg
AV Peak grad: 9.1 mmHg
Ao pk vel: 1.51 m/s
Area-P 1/2: 3.06 cm2
Height: 68 in
MV VTI: 3.33 cm2
S' Lateral: 3.2 cm
Weight: 3584 oz

## 2022-09-24 NOTE — Telephone Encounter (Addendum)
-----   Message from Chesley Mires, MD sent at 09/24/2022  1:29 PM EST ----- I saw Christopher Conley at [facility] for COPD.  He probably with get discharged home tomorrow.  He was being seen by Dr. Shearon Stalls in [place], but would prefer to follow up with me in [place] since he lives in [place].  Can you send a message to Dr. Shearon Stalls to make sure she is okay with him switching to [place].  Assuming she is okay, then please schedule a visit with me in 1 month.  Thanks.  V  Dr. Shearon Stalls please advise, is this okay with you?

## 2022-09-24 NOTE — Progress Notes (Signed)
MD in room to evaluate pt, aware of progression to room air.

## 2022-09-24 NOTE — Progress Notes (Signed)
SATURATION QUALIFICATIONS: (This note is used to comply with regulatory documentation for home oxygen)  Patient Saturations on Room Air @ Rest = 92%  Patient Saturations on Room Air while Ambulating = 90%  Patient Saturations on 2 Liters of oxygen while Ambulating = 95%   Pt ambulated well over 1000 feet (all four hallways) with some exertional dyspnea but no overt SOB, tolerated well.  Will leave pt off O2 at this time and recheck  SaO2  at 12 noon. Advised pt to call if any increased SOB or distress prior to that time, pt states understanding.

## 2022-09-24 NOTE — Consult Note (Signed)
NAME:  Christopher Conley, MRN:  [id number], DOB:  1955-09-18, LOS: 6 ADMISSION DATE:  09/18/2022, CONSULTATION DATE:  09/23/2022 REFERRING MD:  Dr. Dwyane Dee, [facility], CHIEF COMPLAINT:  Short of breath   History of Present Illness:  67 yo male smoker presented to [facility] with 2 weeks of dyspnea, wheezing and productive cough.  SpO2 85% on room air in ER.  Started on therapy for COPD exacerbation.  He had continued increased supplemental oxygen requirements and PCCM consulted to assess.  Pertinent  Medical History  Arthritis, Bipolar, CAD, HTN, HLD  Studies:  CT chest 10/26/20 >> atherosclerosis, calcified mediastinal and Rt hilar LN, diffuse bronchial wall thickening, calcified nodule RLL, fine centrilobular nodularity more in lung apices (smoking RB) PFT 11/05/20 >> FEV1 1.76 (55%), FEV1% 55, TLC 10.80 (163%), DLCO 73%, +BD  Interim History / Subjective:  Breathing much better.  Feels like nebulizer medicines have helped a lot and would prefer to continue this as outpt.  He was weaned off supplemental oxygen this morning.  Objective   Blood pressure 134/73, pulse 78, temperature (!) 97.5 F (36.4 C), temperature source Oral, resp. rate 20, height '5\' 8"'$  (1.727 m), weight 101.6 kg, SpO2 92 %.       No intake or output data in the 24 hours ending 09/24/22 1323  Filed Weights   09/18/22 1419  Weight: 101.6 kg    Examination:  General - alert Eyes - pupils reactive ENT - no sinus tenderness, no stridor Cardiac - regular rate/rhythm, no murmur Chest - much better air movement, scattered rhonchi Abdomen - soft, non tender, + bowel sounds Extremities - no cyanosis, clubbing, or edema Skin - no rashes Neuro - normal strength, moves extremities, follows commands Psych - normal mood and behavior  Assessment & Plan:   Acute hypoxic respiratory failure. - much improved after change to bronchodilator regimen - probably had V/Q mismatching from respiratory secretions and atelectasis - continue  bronchial hygiene - f/u Echo >> hopefully should be done later today  Acute COPD exacerbation. - can wean off prednisone as tolerated over the next 7 days - continue yupelri, brovana, pulmicort with prn albuterol >> he would prefer to continue this nebulizer regimen as an outpatient for maintenance therapy - continue mucinex, flutter valve, incentive spirometry - completed ABx on 1/08  Tobacco abuse. - nicotine patch - he would be a candidate for lung cancer screening as an outpatient  Hx of coronary artery disease, hypertension, hyperlipidemia. - will arrange for Echocardiogram to assess LV and RV function  Upper airway cough syndrome with post nasal drip and globus sensation. - continue fluticasone, azelastine and nasal irrigation  Labs       Latest Ref Rng & Units 09/22/2022    4:07 AM 09/19/2022    4:46 AM 09/18/2022    2:45 PM  CMP  Glucose 70 - 99 mg/dL 211  136  94   BUN 8 - 23 mg/dL '27  23  18   '$ Creatinine 0.61 - 1.24 mg/dL 1.04  0.81  0.81   Sodium 135 - 145 mmol/L 133  136  138   Potassium 3.5 - 5.1 mmol/L 4.3  4.2  4.6   Chloride 98 - 111 mmol/L 100  103  104   CO2 22 - 32 mmol/L '22  24  25   '$ Calcium 8.9 - 10.3 mg/dL 8.2  8.5  8.8        Latest Ref Rng & Units 09/22/2022    4:07 AM 09/19/2022  4:46 AM 09/18/2022    2:45 PM  CBC  WBC 4.0 - 10.5 K/uL 15.2  12.7  9.9   Hemoglobin 13.0 - 17.0 g/dL 15.9  15.5  16.0   Hematocrit 39.0 - 52.0 % 49.5  47.7  49.8   Platelets 150 - 400 K/uL 284  272  243     CBG (last 3)  Recent Labs    09/22/22 1250  GLUCAP 200*    ABG    Component Value Date/Time   HCO3 29.2 (H) 09/18/2022 1445   TCO2 21 06/10/2009 1953   O2SAT 90.3 09/18/2022 1445    Signature:  Chesley Mires, MD [place] Pager - [phone] or [phone] 09/24/2022, 1:23 PM

## 2022-09-24 NOTE — Progress Notes (Signed)
*  PRELIMINARY RESULTS* Echocardiogram 2D Echocardiogram has been performed.  Christopher Conley 09/24/2022, 3:29 PM

## 2022-09-24 NOTE — Progress Notes (Signed)
PROGRESS NOTE    Christopher Conley  PXT:[id number] DOB: [birth year] DOA: 09/18/2022  PCP: Neale Burly, MD   Brief Narrative:  This 67 years old male with PMH significant for COPD, ongoing tobacco abuse, CAD, hypertension and hyperlipidemia presented in the ED with complaints of shortness of breath, productive cough for last 2 weeks. He has been using his regular inhalers without any help,  also has worsening wheezing which prompted him to come to the ED for further evaluation.  Patient is admitted for acute hypoxic respiratory failure secondary to COPD exacerbation requiring Solu-Medrol and nebulized bronchodilators.  Assessment & Plan:   Principal Problem:   COPD exacerbation ([place]) Active Problems:   CAD (coronary artery disease)   Tobacco abuse   Essential hypertension   Acute respiratory failure with hypoxia ([facility])   Upper airway cough syndrome  Acute hypoxic respiratory failure: COPD exacerbation: Patient presented with shortness of breath, productive cough, chest congestion and found to have SpO2 of 85% on room air requiring 5 L of supplemental oxygen on arrival. He also has significant wheezing noted on exam and started on IV Solu-Medrol and nebulized bronchodilators.   He has required up to 14 L of high flow oxygen,  now weaned down to 8 L/min. Initiated IV Solu-Medrol 60 mg every 12 hours, transitioned to prednisone daily Continue scheduled and as needed nebulized bronchodilators. Continue antitussives. Continue IV Rocephin.  Respiratory panel negative. Procalcitonin 0.10 Continue supplemental oxygen and wean as tolerated. Continue inspiratory spirometry and flutter valve Pulmonology consulted, medications adjusted. He is successfully weaned down to 5 L of high flow oxygen.  History of CAD: Continue Effient, metoprolol and statins. He denies any chest pain.  Essential hypertension: Discontinue metoprolol due to COPD. Continue amlodipine 10 mg daily. Continue hydralazine  50 mg 3 times daily. Continue to monitor blood pressure.  Hyperlipidemia: Continue Lipitor 20 mg daily  Tobacco use: Patient was counseled about quit smoking.   He is agreeable and started on nicotine patch.  Obesity: Diet and exercise discussed in detail. Estimated body mass index is 34.06 kg/m as calculated from the following:   Height as of this encounter: '5\' 8"'$  (1.727 m).   Weight as of this encounter: 101.6 kg.    DVT prophylaxis: Lovenox Code Status: Full code Family Communication: No family at bedside. Disposition Plan:   Status is: Inpatient Remains inpatient appropriate because: Admitted for acute hypoxic respiratory failure secondary to COPD exacerbation,  requiring IV Solu-Medrol and supplemental oxygen. Patient is now requiring 8 L of high flow oxygen.   Consultants:  Pulmonology  Procedures: None  Antimicrobials: Ceftriaxone  Subjective: Patient was seen and examined at bedside.  Overnight events noted. He reports doing much better, he continues to use inspiratory spirometry. He is successfully weaned down to 5 L of high flow oxygen.  Objective: Vitals:   09/24/22 0725 09/24/22 0847 09/24/22 0849 09/24/22 0857  BP:      Pulse: 68     Resp: 20     Temp:      TempSrc:      SpO2: 98% 94% 96% 94%  Weight:      Height:       No intake or output data in the 24 hours ending 09/24/22 1058  Filed Weights   09/18/22 1419  Weight: 101.6 kg    Examination:  General exam: Appears comfortable, not in any acute distress.  Deconditioned Respiratory system: CTA bilaterally, respiratory for normal, RR 14. Cardiovascular system: S1 & S2 heard, regular  rate and rhythm, no murmur. Gastrointestinal system: Abdomen is soft, non tender, non distended, BS+ Central nervous system: Alert and oriented X 3. No focal neurological deficits. Extremities: No edema, no cyanosis, no clubbing Skin: No rashes, lesions or ulcers Psychiatry: Judgement and insight appear  normal. Mood & affect appropriate.     Data Reviewed: I have personally reviewed following labs and imaging studies  CBC: Recent Labs  Lab 09/18/22 1445 09/19/22 0446 09/22/22 0407  WBC 9.9 12.7* 15.2*  NEUTROABS 7.0  --   --   HGB 16.0 15.5 15.9  HCT 49.8 47.7 49.5  MCV 94.1 93.2 93.6  PLT 243 272 322   Basic Metabolic Panel: Recent Labs  Lab 09/18/22 1445 09/19/22 0446 09/22/22 0407  NA 138 136 133*  K 4.6 4.2 4.3  CL 104 103 100  CO2 '25 24 22  '$ GLUCOSE 94 136* 211*  BUN 18 23 27*  CREATININE 0.81 0.81 1.04  CALCIUM 8.8* 8.5* 8.2*  MG  --   --  2.2  PHOS  --   --  3.2   GFR: Estimated Creatinine Clearance: 79.6 mL/min (by C-G formula based on SCr of 1.04 mg/dL). Liver Function Tests: No results for input(s): "AST", "ALT", "ALKPHOS", "BILITOT", "PROT", "ALBUMIN" in the last 168 hours. No results for input(s): "LIPASE", "AMYLASE" in the last 168 hours. No results for input(s): "AMMONIA" in the last 168 hours. Coagulation Profile: No results for input(s): "INR", "PROTIME" in the last 168 hours. Cardiac Enzymes: No results for input(s): "CKTOTAL", "CKMB", "CKMBINDEX", "TROPONINI" in the last 168 hours. BNP (last 3 results) No results for input(s): "PROBNP" in the last 8760 hours. HbA1C: No results for input(s): "HGBA1C" in the last 72 hours. CBG: Recent Labs  Lab 09/22/22 1250  GLUCAP 200*   Lipid Profile: No results for input(s): "CHOL", "HDL", "LDLCALC", "TRIG", "CHOLHDL", "LDLDIRECT" in the last 72 hours. Thyroid Function Tests: No results for input(s): "TSH", "T4TOTAL", "FREET4", "T3FREE", "THYROIDAB" in the last 72 hours. Anemia Panel: No results for input(s): "VITAMINB12", "FOLATE", "FERRITIN", "TIBC", "IRON", "RETICCTPCT" in the last 72 hours. Sepsis Labs: Recent Labs  Lab 09/20/22 0953  PROCALCITON <0.10    Recent Results (from the past 240 hour(s))  Resp panel by RT-PCR (RSV, Flu A&B, Covid) Anterior Nasal Swab     Status: None   Collection  Time: 09/18/22  2:55 PM   Specimen: Anterior Nasal Swab  Result Value Ref Range Status   SARS Coronavirus 2 by RT PCR NEGATIVE NEGATIVE Final    Comment: (NOTE) SARS-CoV-2 target nucleic acids are NOT DETECTED.  The SARS-CoV-2 RNA is generally detectable in upper respiratory specimens during the acute phase of infection. The lowest concentration of SARS-CoV-2 viral copies this assay can detect is 138 copies/mL. A negative result does not preclude SARS-Cov-2 infection and should not be used as the sole basis for treatment or other patient management decisions. A negative result may occur with  improper specimen collection/handling, submission of specimen other than nasopharyngeal swab, presence of viral mutation(s) within the areas targeted by this assay, and inadequate number of viral copies(<138 copies/mL). A negative result must be combined with clinical observations, patient history, and epidemiological information. The expected result is Negative.  Fact Sheet for Patients:  EntrepreneurPulse.com.au  Fact Sheet for Healthcare Providers:  IncredibleEmployment.be  This test is no t yet approved or cleared by the [place] FDA and  has been authorized for detection and/or diagnosis of SARS-CoV-2 by FDA under an Emergency Use Authorization (EUA). This EUA  will remain  in effect (meaning this test can be used) for the duration of the COVID-19 declaration under Section 564(b)(1) of the Act, 21 U.S.C.section 360bbb-3(b)(1), unless the authorization is terminated  or revoked sooner.       Influenza A by PCR NEGATIVE NEGATIVE Final   Influenza B by PCR NEGATIVE NEGATIVE Final    Comment: (NOTE) The Xpert Xpress SARS-CoV-2/FLU/RSV plus assay is intended as an aid in the diagnosis of influenza from Nasopharyngeal swab specimens and should not be used as a sole basis for treatment. Nasal washings and aspirates are unacceptable for Xpert Xpress  SARS-CoV-2/FLU/RSV testing.  Fact Sheet for Patients: EntrepreneurPulse.com.au  Fact Sheet for Healthcare Providers: IncredibleEmployment.be  This test is not yet approved or cleared by the [place] FDA and has been authorized for detection and/or diagnosis of SARS-CoV-2 by FDA under an Emergency Use Authorization (EUA). This EUA will remain in effect (meaning this test can be used) for the duration of the COVID-19 declaration under Section 564(b)(1) of the Act, 21 U.S.C. section 360bbb-3(b)(1), unless the authorization is terminated or revoked.     Resp Syncytial Virus by PCR NEGATIVE NEGATIVE Final    Comment: (NOTE) Fact Sheet for Patients: EntrepreneurPulse.com.au  Fact Sheet for Healthcare Providers: IncredibleEmployment.be  This test is not yet approved or cleared by the [place] FDA and has been authorized for detection and/or diagnosis of SARS-CoV-2 by FDA under an Emergency Use Authorization (EUA). This EUA will remain in effect (meaning this test can be used) for the duration of the COVID-19 declaration under Section 564(b)(1) of the Act, 21 U.S.C. section 360bbb-3(b)(1), unless the authorization is terminated or revoked.  Performed at [facility], [address], [place], [place] [address]     Radiology Studies: DG Chest 2 View  Result Date: 09/24/2022 CLINICAL DATA:  Dyspnea. EXAM: CHEST - 2 VIEW COMPARISON:  Chest radiographs 09/18/2022 FINDINGS: The cardiomediastinal silhouette is unchanged with normal heart size. Aortic atherosclerosis is noted. Mild left basilar atelectasis or scarring is unchanged. A calcified granuloma is again noted laterally in the right lower lung. No acute airspace consolidation, overt pulmonary edema, sizable pleural effusion, or pneumothorax is identified. No acute osseous abnormality is seen. IMPRESSION: No active cardiopulmonary disease. Electronically  Signed   By: Logan Bores M.D.   On: 09/24/2022 09:59    Scheduled Meds:  amLODipine  10 mg Oral Daily   arformoterol  15 mcg Nebulization BID   atorvastatin  20 mg Oral Daily   azelastine  1 spray Each Nare q morning   budesonide (PULMICORT) nebulizer solution  0.5 mg Nebulization BID   docusate sodium  100 mg Oral BID   enoxaparin (LOVENOX) injection  40 mg Subcutaneous Q24H   fluticasone  1 spray Each Nare QHS   guaiFENesin  1,200 mg Oral BID   hydrALAZINE  50 mg Oral Q8H   nicotine  21 mg Transdermal Daily   pantoprazole  40 mg Oral Daily   prasugrel  10 mg Oral Daily   predniSONE  30 mg Oral Q breakfast   revefenacin  175 mcg Nebulization Daily   senna-docusate  1 tablet Oral BID   sodium chloride  2 spray Each Nare BID   tamsulosin  0.4 mg Oral Daily   Continuous Infusions:     LOS: 6 days    Time spent: 35 mins    Louvinia Cumbo, MD [facility]   If 7PM-7AM, please contact night-coverage

## 2022-09-25 DIAGNOSIS — J441 Chronic obstructive pulmonary disease with (acute) exacerbation: Secondary | ICD-10-CM | POA: Diagnosis not present

## 2022-09-25 MED ORDER — BUDESONIDE 0.5 MG/2ML IN SUSP: 0.5000 mg | Freq: Two times a day (BID) | RESPIRATORY_TRACT | 12 refills | Status: AC

## 2022-09-25 MED ORDER — PREDNISONE 10 MG PO TABS: ORAL_TABLET | ORAL | 0 refills | Status: AC

## 2022-09-25 MED ORDER — REVEFENACIN 175 MCG/3ML IN SOLN: 175.0000 ug | Freq: Every day | RESPIRATORY_TRACT | 0 refills | Status: AC

## 2022-09-25 MED ORDER — FLUTICASONE PROPIONATE 50 MCG/ACT NA SUSP: 1.0000 | Freq: Every day | NASAL | 2 refills | Status: AC

## 2022-09-25 MED ORDER — ARFORMOTEROL TARTRATE 15 MCG/2ML IN NEBU: 15.0000 ug | INHALATION_SOLUTION | Freq: Two times a day (BID) | RESPIRATORY_TRACT | 1 refills | Status: AC

## 2022-09-25 NOTE — Progress Notes (Signed)
Patient has rested well this shift, he is maintaining 94% o2 saturation on room air. No complaints of pain or discomfort at this time.

## 2022-09-25 NOTE — Plan of Care (Signed)

## 2022-09-25 NOTE — Care Management Important Message (Signed)
Important Message  Patient Details  Name: Christopher Conley MRN: [id number] Date of Birth: [birth year]   Medicare Important Message Given:  Yes     Tommy Medal 09/25/2022, 10:43 AM

## 2022-09-25 NOTE — Discharge Summary (Signed)
Physician Discharge Summary  Christopher Conley RDE:[id number] DOB: 01/11/1956 DOA: 09/18/2022  PCP: Neale Burly, MD  Admit date: 09/18/2022  Discharge date: 09/25/2022  Admitted From: Home.  Disposition:  Home.  Recommendations for Outpatient Follow-up:  Follow up with PCP in 1-2 weeks. Please obtain BMP/CBC in one week. 3.   Patient has completed antibiotics treatment for COPD exacerbation. 4.   Advised to  take prednisone 30 mg for 2 days then decrease to prednisone 20 mg for 2 days then decrease to prednisone 10 mg for 1 day then discontinue. 5.   Advised to follow-up with pulmonology as scheduled.  Appointment has been made.  Home Health:None. Equipment/Devices:None.  Discharge Condition: Stable CODE STATUS:Full code Diet recommendation: Heart Healthy   Brief [facility] Course: This 67 years old male with PMH significant for COPD, ongoing tobacco abuse, CAD, hypertension and hyperlipidemia presented in the ED with complaints of shortness of breath, productive cough for last 2 weeks. He has been using his regular inhalers without any help, also has worsening wheezing which prompted him to come to the ED for further evaluation. Patient was admitted for acute hypoxic respiratory failure secondary to COPD exacerbation requiring Solu-Medrol and nebulized bronchodilators. Patient initially has worsening of his symptoms. His oxygen requirement has increased up to 15 L high flow nasal cannula.  Pulmonology consulted, much improved after change of bronchodilator regimen.  He is successfully weaned down to room air.  Patient has completed treatment of antibiotics for COPD exacerbation.  Patient feels much better and wants to be discharged.  Echocardiogram completely normal LVEF 65 to 70%.  No RWMA.  Pulmonology agreed with the discharge plan and with outpatient follow-up.  Discharge Diagnoses:  Principal Problem:   COPD exacerbation ([place]) Active Problems:   CAD (coronary artery disease)    Tobacco abuse   Essential hypertension   Acute respiratory failure with hypoxia (HCC)   Upper airway cough syndrome  Acute hypoxic respiratory failure: COPD exacerbation: Patient presented with shortness of breath, productive cough, chest congestion and found to have SpO2 of 85% on room air requiring 5 L of supplemental oxygen on arrival. He also has significant wheezing noted on exam and started on IV Solu-Medrol and nebulized bronchodilators.   He has required up to 15 L of high flow oxygen,  now weaned down to 8 L/min. Initiated IV Solu-Medrol 60 mg every 12 hours, transitioned to prednisone daily Continue scheduled and as needed nebulized bronchodilators. Continue antitussives. Continue IV Rocephin.  Respiratory panel negative. Procalcitonin 0.10 Continue supplemental oxygen and wean as tolerated. Continue inspiratory spirometry and flutter valve Pulmonology consulted, medications adjusted. He is successfully weaned down to room air before discharge.   History of CAD: Continue Effient, metoprolol and statins. He denies any chest pain.   Essential hypertension: Continue metoprolol  Continue amlodipine 10 mg daily. Continue to monitor blood pressure.   Hyperlipidemia: Continue Lipitor 20 mg daily   Tobacco use: Patient was counseled about quit smoking.   He is agreeable and started on nicotine patch.   Obesity: Diet and exercise discussed in detail. Estimated body mass index is 34.06 kg/m as calculated from the following:   Height as of this encounter: '5\' 8"'$  (1.727 m).   Weight as of this encounter: 101.6 kg.   Discharge Instructions  Discharge Instructions     Call MD for:  difficulty breathing, headache or visual disturbances   Complete by: As directed    Call MD for:  persistant dizziness or light-headedness  Complete by: As directed    Call MD for:  persistant nausea and vomiting   Complete by: As directed    Diet - low sodium heart healthy   Complete by:  As directed    Diet Carb Modified   Complete by: As directed    Discharge instructions   Complete by: As directed    Advised to follow-up with primary care physician in 1 week. Patient has completed antibiotic treatment for COPD exacerbation. Advised to  take prednisone 30 mg for 2 days then decrease to prednisone 20 mg for 2 days then decrease to prednisone 10 mg for 1 day then discontinue. Advised to follow-up with pulmonology as scheduled.  Appointment has been made.   Increase activity slowly   Complete by: As directed       Allergies as of 09/25/2022   No Known Allergies      Medication List     STOP taking these medications    citalopram 20 MG tablet Commonly known as: CeleXA       TAKE these medications    acetaminophen 500 MG tablet Commonly known as: TYLENOL Take 1,000 mg by mouth every 6 (six) hours as needed for mild pain.   amLODipine 10 MG tablet Commonly known as: NORVASC Take 10 mg by mouth daily.   arformoterol 15 MCG/2ML Nebu Commonly known as: BROVANA Take 2 mLs (15 mcg total) by nebulization 2 (two) times daily.   budesonide 0.5 MG/2ML nebulizer solution Commonly known as: PULMICORT Take 2 mLs (0.5 mg total) by nebulization 2 (two) times daily.   Effient 10 MG Tabs tablet Generic drug: prasugrel Take 1 tablet (10 mg total) by mouth daily.   fluticasone 50 MCG/ACT nasal spray Commonly known as: FLONASE Place 1 spray into both nostrils at bedtime.   ipratropium-albuterol 0.5-2.5 (3) MG/3ML Soln Commonly known as: DUONEB INHALE CONTENTS OF 1 VIAL IN NEBULIZER FOUR TIMES DAILY   metoprolol tartrate 50 MG tablet Commonly known as: LOPRESSOR Take 25 mg by mouth 2 (two) times daily.   predniSONE 10 MG tablet Commonly known as: DELTASONE Patient advised to take prednisone 30 mg for 2 days then decrease to prednisone 20 mg for 2 days then decrease to prednisone 10 mg for 1 day then discontinue. Start taking on: September 26, 2022    revefenacin 175 MCG/3ML nebulizer solution Commonly known as: YUPELRI Take 3 mLs (175 mcg total) by nebulization daily. Start taking on: September 26, 2022   simvastatin 40 MG tablet Commonly known as: ZOCOR Take 40 mg by mouth daily.   tamsulosin 0.4 MG Caps capsule Commonly known as: FLOMAX Take 0.4 mg by mouth daily.   Ventolin HFA 108 (90 Base) MCG/ACT inhaler Generic drug: albuterol Inhale 2 puffs into the lungs every 6 (six) hours as needed for wheezing or shortness of breath. as directed        Follow-up Information     [facility]. Schedule an appointment as soon as possible for a visit .   Specialty: Pulmonology Why: For recheck of your symptoms Contact information: [address], [address] [place] [address] [phone]        Neale Burly, MD. Schedule an appointment as soon as possible for a visit .   Specialty: Internal Medicine Why: For recheck of your symptoms Contact information: [address] [place] [phone]                No Known Allergies  Consultations: Pulmonology   Procedures/Studies: ECHOCARDIOGRAM COMPLETE  Result Date: 09/24/2022    ECHOCARDIOGRAM REPORT   Patient Name:   Christopher Conley Date of Exam: 09/24/2022 Medical Rec #:  [id number]     Height:       68.0 in Accession #:    [id number]    Weight:       224.0 lb Date of Birth:  1955-12-20     BSA:          2.145 m Patient Age:    9 years      BP:           134/73 mmHg Patient Gender: M             HR:           63 bpm. Exam Location:  [facility] Procedure: 2D Echo, Cardiac Doppler and Color Doppler Indications:    Dyspnea  History:        Patient has prior history of Echocardiogram examinations, most                 recent 06/19/2005. CAD, COPD; Risk Factors:Hypertension and                 Current Smoker.  Sonographer:    Wenda Low Referring Phys: Toney Sang SOOD  Sonographer Comments: Image acquisition challenging  due to respiratory motion and Image acquisition challenging due to COPD. IMPRESSIONS  1. Left ventricular ejection fraction, by estimation, is 60 to 65%. The left ventricle has normal function. The left ventricle has no regional wall motion abnormalities. There is moderate left ventricular hypertrophy. Left ventricular diastolic parameters are indeterminate.  2. Right ventricular systolic function was not well visualized. The right ventricular size is normal.  3. The mitral valve is normal in structure. No evidence of mitral valve regurgitation. No evidence of mitral stenosis.  4. The aortic valve is tricuspid. Aortic valve regurgitation is not visualized. No aortic stenosis is present.  5. The inferior vena cava is normal in size with greater than 50% respiratory variability, suggesting right atrial pressure of 3 mmHg. Comparison(s): No prior Echocardiogram. FINDINGS  Left Ventricle: Left ventricular ejection fraction, by estimation, is 60 to 65%. The left ventricle has normal function. The left ventricle has no regional wall motion abnormalities. The left ventricular internal cavity size was normal in size. There is  moderate left ventricular hypertrophy. Left ventricular diastolic parameters are indeterminate. Right Ventricle: The right ventricular size is normal. Right vetricular wall thickness was not well visualized. Right ventricular systolic function was not well visualized. Left Atrium: Left atrial size was normal in size. Right Atrium: Right atrial size was normal in size. Pericardium: There is no evidence of pericardial effusion. Mitral Valve: The mitral valve is normal in structure. No evidence of mitral valve regurgitation. No evidence of mitral valve stenosis. MV peak gradient, 4.1 mmHg. The mean mitral valve gradient is 1.0 mmHg. Tricuspid Valve: The tricuspid valve is normal in structure. Tricuspid valve regurgitation is not demonstrated. No evidence of tricuspid stenosis. Aortic Valve: The aortic  valve is tricuspid. Aortic valve regurgitation is not visualized. No aortic stenosis is present. Aortic valve mean gradient measures 4.0 mmHg. Aortic valve peak gradient measures 9.1 mmHg. Aortic valve area, by VTI measures 2.55 cm. Pulmonic Valve: The pulmonic valve was not well visualized. Pulmonic valve regurgitation is not visualized. No evidence of pulmonic stenosis. Aorta: The aortic root is normal in size and structure. Venous: The inferior vena cava is normal  in size with greater than 50% respiratory variability, suggesting right atrial pressure of 3 mmHg. IAS/Shunts: No atrial level shunt detected by color flow Doppler.  LEFT VENTRICLE PLAX 2D LVIDd:         5.30 cm   Diastology LVIDs:         3.20 cm   LV e' medial:    6.96 cm/s LV PW:         1.10 cm   LV E/e' medial:  12.4 LV IVS:        1.40 cm   LV e' lateral:   9.14 cm/s LVOT diam:     2.00 cm   LV E/e' lateral: 9.5 LV SV:         85 LV SV Index:   39 LVOT Area:     3.14 cm  RIGHT VENTRICLE RV Basal diam:  3.45 cm RV Mid diam:    3.10 cm RV S prime:     16.90 cm/s TAPSE (M-mode): 3.0 cm LEFT ATRIUM             Index        RIGHT ATRIUM           Index LA diam:        4.10 cm 1.91 cm/m   RA Area:     13.00 cm LA Vol (A2C):   45.3 ml 21.12 ml/m  RA Volume:   32.10 ml  14.97 ml/m LA Vol (A4C):   51.2 ml 23.87 ml/m LA Biplane Vol: 48.9 ml 22.80 ml/m  AORTIC VALVE                    PULMONIC VALVE AV Area (Vmax):    2.41 cm     PV Vmax:       1.12 m/s AV Area (Vmean):   2.37 cm     PV Peak grad:  5.0 mmHg AV Area (VTI):     2.55 cm AV Vmax:           151.00 cm/s AV Vmean:          92.700 cm/s AV VTI:            0.332 m AV Peak Grad:      9.1 mmHg AV Mean Grad:      4.0 mmHg LVOT Vmax:         116.00 cm/s LVOT Vmean:        69.900 cm/s LVOT VTI:          0.269 m LVOT/AV VTI ratio: 0.81  AORTA Ao Root diam: 3.40 cm MITRAL VALVE MV Area (PHT): 3.06 cm    SHUNTS MV Area VTI:   3.33 cm    Systemic VTI:  0.27 m MV Peak grad:  4.1 mmHg    Systemic  Diam: 2.00 cm MV Mean grad:  1.0 mmHg MV Vmax:       1.01 m/s MV Vmean:      53.1 cm/s MV Decel Time: 248 msec MV E velocity: 86.50 cm/s MV A velocity: 89.10 cm/s MV E/A ratio:  0.97 Vishnu Priya Mallipeddi Electronically signed by Lorelee Cover Mallipeddi Signature Date/Time: 09/24/2022/4:13:27 PM    Final    DG Chest 2 View  Result Date: 09/24/2022 CLINICAL DATA:  Dyspnea. EXAM: CHEST - 2 VIEW COMPARISON:  Chest radiographs 09/18/2022 FINDINGS: The cardiomediastinal silhouette is unchanged with normal heart size. Aortic atherosclerosis is noted. Mild left basilar atelectasis or scarring is unchanged. A calcified granuloma is  again noted laterally in the right lower lung. No acute airspace consolidation, overt pulmonary edema, sizable pleural effusion, or pneumothorax is identified. No acute osseous abnormality is seen. IMPRESSION: No active cardiopulmonary disease. Electronically Signed   By: Logan Bores M.D.   On: 09/24/2022 09:59   DG Chest 2 View  Result Date: 09/18/2022 CLINICAL DATA:  Shortness of breath. EXAM: CHEST - 2 VIEW COMPARISON:  06/11/2009 FINDINGS: The lungs are clear without focal pneumonia, edema, pneumothorax or pleural effusion. Stable nodule peripheral right lower lung, likely calcified granuloma. The cardiopericardial silhouette is within normal limits for size. Streaky opacity at the left base is compatible with atelectasis or scarring. The visualized bony structures of the thorax are unremarkable. IMPRESSION: No active cardiopulmonary disease. Electronically Signed   By: Misty Stanley M.D.   On: 09/18/2022 15:36    Subjective: Patient was seen and examined at bedside.  Overnight events noted.   Patient report doing much better.He is successfully weaned down to room air.   Ambulated without oxygen requirement.  Patient is being discharged home.  Discharge Exam: Vitals:   09/25/22 0823 09/25/22 0835  BP:    Pulse:    Resp:    Temp:    SpO2: (!) 89% 93%   Vitals:    09/25/22 0455 09/25/22 0808 09/25/22 0823 09/25/22 0835  BP: 132/65 (!) 153/69    Pulse: (!) 58 72    Resp: (!) 22     Temp: (!) 97.4 F (36.3 C)     TempSrc: Oral     SpO2: 94%  (!) 89% 93%  Weight:      Height:        General: Pt is alert, awake, not in acute distress Cardiovascular: RRR, S1/S2 +, no rubs, no gallops Respiratory: CTA bilaterally, no wheezing, no rhonchi Abdominal: Soft, NT, ND, bowel sounds + Extremities: no edema, no cyanosis    The results of significant diagnostics from this hospitalization (including imaging, microbiology, ancillary and laboratory) are listed below for reference.     Microbiology: Recent Results (from the past 240 hour(s))  Resp panel by RT-PCR (RSV, Flu A&B, Covid) Anterior Nasal Swab     Status: None   Collection Time: 09/18/22  2:55 PM   Specimen: Anterior Nasal Swab  Result Value Ref Range Status   SARS Coronavirus 2 by RT PCR NEGATIVE NEGATIVE Final    Comment: (NOTE) SARS-CoV-2 target nucleic acids are NOT DETECTED.  The SARS-CoV-2 RNA is generally detectable in upper respiratory specimens during the acute phase of infection. The lowest concentration of SARS-CoV-2 viral copies this assay can detect is 138 copies/mL. A negative result does not preclude SARS-Cov-2 infection and should not be used as the sole basis for treatment or other patient management decisions. A negative result may occur with  improper specimen collection/handling, submission of specimen other than nasopharyngeal swab, presence of viral mutation(s) within the areas targeted by this assay, and inadequate number of viral copies(<138 copies/mL). A negative result must be combined with clinical observations, patient history, and epidemiological information. The expected result is Negative.  Fact Sheet for Patients:  EntrepreneurPulse.com.au  Fact Sheet for Healthcare Providers:  IncredibleEmployment.be  This test is no  t yet approved or cleared by the [place] FDA and  has been authorized for detection and/or diagnosis of SARS-CoV-2 by FDA under an Emergency Use Authorization (EUA). This EUA will remain  in effect (meaning this test can be used) for the duration of the COVID-19 declaration under Section 564(b)(1)  of the Act, 21 U.S.C.section 360bbb-3(b)(1), unless the authorization is terminated  or revoked sooner.       Influenza A by PCR NEGATIVE NEGATIVE Final   Influenza B by PCR NEGATIVE NEGATIVE Final    Comment: (NOTE) The Xpert Xpress SARS-CoV-2/FLU/RSV plus assay is intended as an aid in the diagnosis of influenza from Nasopharyngeal swab specimens and should not be used as a sole basis for treatment. Nasal washings and aspirates are unacceptable for Xpert Xpress SARS-CoV-2/FLU/RSV testing.  Fact Sheet for Patients: EntrepreneurPulse.com.au  Fact Sheet for Healthcare Providers: IncredibleEmployment.be  This test is not yet approved or cleared by the [place] FDA and has been authorized for detection and/or diagnosis of SARS-CoV-2 by FDA under an Emergency Use Authorization (EUA). This EUA will remain in effect (meaning this test can be used) for the duration of the COVID-19 declaration under Section 564(b)(1) of the Act, 21 U.S.C. section 360bbb-3(b)(1), unless the authorization is terminated or revoked.     Resp Syncytial Virus by PCR NEGATIVE NEGATIVE Final    Comment: (NOTE) Fact Sheet for Patients: EntrepreneurPulse.com.au  Fact Sheet for Healthcare Providers: IncredibleEmployment.be  This test is not yet approved or cleared by the [place] FDA and has been authorized for detection and/or diagnosis of SARS-CoV-2 by FDA under an Emergency Use Authorization (EUA). This EUA will remain in effect (meaning this test can be used) for the duration of the COVID-19 declaration under Section  564(b)(1) of the Act, 21 U.S.C. section 360bbb-3(b)(1), unless the authorization is terminated or revoked.  Performed at [facility], [address], [place], [place] [address]      Labs: BNP (last 3 results) Recent Labs    09/18/22 1445  BNP 60.4   Basic Metabolic Panel: Recent Labs  Lab 09/18/22 1445 09/19/22 0446 09/22/22 0407  NA 138 136 133*  K 4.6 4.2 4.3  CL 104 103 100  CO2 '25 24 22  '$ GLUCOSE 94 136* 211*  BUN 18 23 27*  CREATININE 0.81 0.81 1.04  CALCIUM 8.8* 8.5* 8.2*  MG  --   --  2.2  PHOS  --   --  3.2   Liver Function Tests: No results for input(s): "AST", "ALT", "ALKPHOS", "BILITOT", "PROT", "ALBUMIN" in the last 168 hours. No results for input(s): "LIPASE", "AMYLASE" in the last 168 hours. No results for input(s): "AMMONIA" in the last 168 hours. CBC: Recent Labs  Lab 09/18/22 1445 09/19/22 0446 09/22/22 0407  WBC 9.9 12.7* 15.2*  NEUTROABS 7.0  --   --   HGB 16.0 15.5 15.9  HCT 49.8 47.7 49.5  MCV 94.1 93.2 93.6  PLT 243 272 284   Cardiac Enzymes: No results for input(s): "CKTOTAL", "CKMB", "CKMBINDEX", "TROPONINI" in the last 168 hours. BNP: Invalid input(s): "POCBNP" CBG: Recent Labs  Lab 09/22/22 1250  GLUCAP 200*   D-Dimer No results for input(s): "DDIMER" in the last 72 hours. Hgb A1c No results for input(s): "HGBA1C" in the last 72 hours. Lipid Profile No results for input(s): "CHOL", "HDL", "LDLCALC", "TRIG", "CHOLHDL", "LDLDIRECT" in the last 72 hours. Thyroid function studies No results for input(s): "TSH", "T4TOTAL", "T3FREE", "THYROIDAB" in the last 72 hours.  Invalid input(s): "FREET3" Anemia work up No results for input(s): "VITAMINB12", "FOLATE", "FERRITIN", "TIBC", "IRON", "RETICCTPCT" in the last 72 hours. Urinalysis No results found for: "COLORURINE", "APPEARANCEUR", "LABSPEC", "PHURINE", "GLUCOSEU", "HGBUR", "BILIRUBINUR", "KETONESUR", "PROTEINUR", "UROBILINOGEN", "NITRITE", "LEUKOCYTESUR" Sepsis Labs Recent  Labs  Lab 09/18/22 1445 09/19/22 0446 09/22/22 0407  WBC 9.9 12.7* 15.2*  Microbiology Recent Results (from the past 240 hour(s))  Resp panel by RT-PCR (RSV, Flu A&B, Covid) Anterior Nasal Swab     Status: None   Collection Time: 09/18/22  2:55 PM   Specimen: Anterior Nasal Swab  Result Value Ref Range Status   SARS Coronavirus 2 by RT PCR NEGATIVE NEGATIVE Final    Comment: (NOTE) SARS-CoV-2 target nucleic acids are NOT DETECTED.  The SARS-CoV-2 RNA is generally detectable in upper respiratory specimens during the acute phase of infection. The lowest concentration of SARS-CoV-2 viral copies this assay can detect is 138 copies/mL. A negative result does not preclude SARS-Cov-2 infection and should not be used as the sole basis for treatment or other patient management decisions. A negative result may occur with  improper specimen collection/handling, submission of specimen other than nasopharyngeal swab, presence of viral mutation(s) within the areas targeted by this assay, and inadequate number of viral copies(<138 copies/mL). A negative result must be combined with clinical observations, patient history, and epidemiological information. The expected result is Negative.  Fact Sheet for Patients:  EntrepreneurPulse.com.au  Fact Sheet for Healthcare Providers:  IncredibleEmployment.be  This test is no t yet approved or cleared by the [place] FDA and  has been authorized for detection and/or diagnosis of SARS-CoV-2 by FDA under an Emergency Use Authorization (EUA). This EUA will remain  in effect (meaning this test can be used) for the duration of the COVID-19 declaration under Section 564(b)(1) of the Act, 21 U.S.C.section 360bbb-3(b)(1), unless the authorization is terminated  or revoked sooner.       Influenza A by PCR NEGATIVE NEGATIVE Final   Influenza B by PCR NEGATIVE NEGATIVE Final    Comment: (NOTE) The Xpert Xpress  SARS-CoV-2/FLU/RSV plus assay is intended as an aid in the diagnosis of influenza from Nasopharyngeal swab specimens and should not be used as a sole basis for treatment. Nasal washings and aspirates are unacceptable for Xpert Xpress SARS-CoV-2/FLU/RSV testing.  Fact Sheet for Patients: EntrepreneurPulse.com.au  Fact Sheet for Healthcare Providers: IncredibleEmployment.be  This test is not yet approved or cleared by the [place] FDA and has been authorized for detection and/or diagnosis of SARS-CoV-2 by FDA under an Emergency Use Authorization (EUA). This EUA will remain in effect (meaning this test can be used) for the duration of the COVID-19 declaration under Section 564(b)(1) of the Act, 21 U.S.C. section 360bbb-3(b)(1), unless the authorization is terminated or revoked.     Resp Syncytial Virus by PCR NEGATIVE NEGATIVE Final    Comment: (NOTE) Fact Sheet for Patients: EntrepreneurPulse.com.au  Fact Sheet for Healthcare Providers: IncredibleEmployment.be  This test is not yet approved or cleared by the [place] FDA and has been authorized for detection and/or diagnosis of SARS-CoV-2 by FDA under an Emergency Use Authorization (EUA). This EUA will remain in effect (meaning this test can be used) for the duration of the COVID-19 declaration under Section 564(b)(1) of the Act, 21 U.S.C. section 360bbb-3(b)(1), unless the authorization is terminated or revoked.  Performed at [facility], [address] [place], [place] [address]      Time coordinating discharge: Over 30 minutes  SIGNED:   Shawna Clamp, MD  [facility] 09/25/2022, 1:58 PM Pager   If 7PM-7AM, please contact night-coverage

## 2022-09-25 NOTE — Progress Notes (Signed)
NAME:  Christopher Conley, MRN:  [id number], DOB:  1956-05-24, LOS: 7 ADMISSION DATE:  09/18/2022, CONSULTATION DATE:  09/23/2022 REFERRING MD:  Dr. Dwyane Dee, [facility], CHIEF COMPLAINT:  Short of breath   History of Present Illness:  67 yo male smoker presented to [facility] with 2 weeks of dyspnea, wheezing and productive cough.  SpO2 85% on room air in ER.  Started on therapy for COPD exacerbation.  He had continued increased supplemental oxygen requirements and PCCM consulted to assess.  Pertinent  Medical History  Arthritis, Bipolar, CAD, HTN, HLD  Studies:  CT chest 10/26/20 >> atherosclerosis, calcified mediastinal and Rt hilar LN, diffuse bronchial wall thickening, calcified nodule RLL, fine centrilobular nodularity more in lung apices (smoking RB) PFT 11/05/20 >> FEV1 1.76 (55%), FEV1% 55, TLC 10.80 (163%), DLCO 73%, +BD Echo 09/24/22 >> EF 60 to 65%, mod LVH  Interim History / Subjective:  No longer needing supplemental oxygen.  Breathing better.  Anxious to go home.  Objective   Blood pressure (!) 153/69, pulse 72, temperature (!) 97.4 F (36.3 C), temperature source Oral, resp. rate (!) 22, height '5\' 8"'$  (1.727 m), weight 101.6 kg, SpO2 93 %.        Intake/Output Summary (Last 24 hours) at 09/25/2022 1002 Last data filed at 09/25/2022 0900 Gross per 24 hour  Intake 720 ml  Output --  Net 720 ml    Filed Weights   09/18/22 1419  Weight: 101.6 kg    Examination:  General - alert Eyes - pupils reactive ENT - no sinus tenderness, no stridor Cardiac - regular rate/rhythm, no murmur Chest - equal breath sounds b/l, no wheezing or rales Abdomen - soft, non tender, + bowel sounds Extremities - no cyanosis, clubbing, or edema Skin - no rashes Neuro - normal strength, moves extremities, follows commands Psych - normal mood and behavior  Assessment & Plan:   Acute hypoxic respiratory failure. - probably had V/Q mismatching from respiratory secretions and atelectasis - no longer needing  supplemental oxygen  Acute COPD exacerbation. - wean off prednisone over the next 5 days - continue yupelri, brovana, pulmicort with prn albuterol >> he would prefer to continue this nebulizer regimen as an outpatient for maintenance therapy - continue mucinex, flutter valve, incentive spirometry - completed ABx on 1/08  Tobacco abuse. - nicotine patch - he would be a candidate for lung cancer screening as an outpatient  Hx of coronary artery disease, hypertension, hyperlipidemia. - normal EF on Echo  Upper airway cough syndrome with post nasal drip and globus sensation. - continue fluticasone, azelastine and nasal irrigation  Okay for d/c home from pulmonary standpoint.  Our office will call to schedule pulmonary follow up in [place] office in 3 to 4 weeks.  Please call if additional assistance needed while he is in hospital.  Labs       Latest Ref Rng & Units 09/22/2022    4:07 AM 09/19/2022    4:46 AM 09/18/2022    2:45 PM  CMP  Glucose 70 - 99 mg/dL 211  136  94   BUN 8 - 23 mg/dL '27  23  18   '$ Creatinine 0.61 - 1.24 mg/dL 1.04  0.81  0.81   Sodium 135 - 145 mmol/L 133  136  138   Potassium 3.5 - 5.1 mmol/L 4.3  4.2  4.6   Chloride 98 - 111 mmol/L 100  103  104   CO2 22 - 32 mmol/L 22  24  25  Calcium 8.9 - 10.3 mg/dL 8.2  8.5  8.8        Latest Ref Rng & Units 09/22/2022    4:07 AM 09/19/2022    4:46 AM 09/18/2022    2:45 PM  CBC  WBC 4.0 - 10.5 K/uL 15.2  12.7  9.9   Hemoglobin 13.0 - 17.0 g/dL 15.9  15.5  16.0   Hematocrit 39.0 - 52.0 % 49.5  47.7  49.8   Platelets 150 - 400 K/uL 284  272  243     CBG (last 3)  Recent Labs    09/22/22 1250  GLUCAP 200*    ABG    Component Value Date/Time   HCO3 29.2 (H) 09/18/2022 1445   TCO2 21 06/10/2009 1953   O2SAT 90.3 09/18/2022 1445    Signature:  Chesley Mires, MD [place] Pager - [phone] or [phone] 09/25/2022, 10:02 AM

## 2022-09-25 NOTE — Progress Notes (Signed)
Patient discharged home today, transported home by family. Discharge paperwork went over with patient, patient verbalized understanding. Belongings sent home with patient.  ?

## 2022-09-30 DIAGNOSIS — J449 Chronic obstructive pulmonary disease, unspecified: Secondary | ICD-10-CM | POA: Diagnosis not present

## 2022-09-30 DIAGNOSIS — Z6831 Body mass index (BMI) 31.0-31.9, adult: Secondary | ICD-10-CM | POA: Diagnosis not present

## 2022-10-05 NOTE — Telephone Encounter (Signed)
I am covering for Dr. Shearon Stalls, this should be fine based on the patient's preference.  Thanks, JD

## 2022-10-06 DIAGNOSIS — M961 Postlaminectomy syndrome, not elsewhere classified: Secondary | ICD-10-CM | POA: Diagnosis not present

## 2022-10-06 NOTE — Telephone Encounter (Signed)
Thanks!  Patient scheduled for 10/24/22 with Dr.Sood in [facility] office. Nothing further needed

## 2022-10-24 ENCOUNTER — Ambulatory Visit: Payer: Medicare HMO | Admitting: Pulmonary Disease

## 2022-11-12 DIAGNOSIS — J449 Chronic obstructive pulmonary disease, unspecified: Secondary | ICD-10-CM | POA: Diagnosis not present

## 2022-11-12 DIAGNOSIS — Z6833 Body mass index (BMI) 33.0-33.9, adult: Secondary | ICD-10-CM | POA: Diagnosis not present

## 2022-11-12 DIAGNOSIS — J4520 Mild intermittent asthma, uncomplicated: Secondary | ICD-10-CM | POA: Diagnosis not present

## 2022-11-12 DIAGNOSIS — N401 Enlarged prostate with lower urinary tract symptoms: Secondary | ICD-10-CM | POA: Diagnosis not present

## 2022-11-12 DIAGNOSIS — I10 Essential (primary) hypertension: Secondary | ICD-10-CM | POA: Diagnosis not present

## 2022-11-12 DIAGNOSIS — M545 Low back pain, unspecified: Secondary | ICD-10-CM | POA: Diagnosis not present

## 2022-11-12 DIAGNOSIS — Z0000 Encounter for general adult medical examination without abnormal findings: Secondary | ICD-10-CM | POA: Diagnosis not present

## 2022-12-04 ENCOUNTER — Ambulatory Visit: Payer: Medicare HMO | Admitting: Pulmonary Disease

## 2023-02-14 DEATH — deceased
